# Patient Record
Sex: Female | Born: 1935 | Race: White | Hispanic: No | Smoking: Former smoker
Health system: Southern US, Community
[De-identification: ages and names within clinical notes are randomized; demographics above are authoritative.]

## PROBLEM LIST (undated history)

## (undated) DIAGNOSIS — I808 Phlebitis and thrombophlebitis of other sites: Secondary | ICD-10-CM

## (undated) DIAGNOSIS — K449 Diaphragmatic hernia without obstruction or gangrene: Secondary | ICD-10-CM

## (undated) DIAGNOSIS — E785 Hyperlipidemia, unspecified: Secondary | ICD-10-CM

## (undated) DIAGNOSIS — Z87891 Personal history of nicotine dependence: Secondary | ICD-10-CM

## (undated) DIAGNOSIS — K922 Gastrointestinal hemorrhage, unspecified: Secondary | ICD-10-CM

## (undated) DIAGNOSIS — K219 Gastro-esophageal reflux disease without esophagitis: Secondary | ICD-10-CM

## (undated) DIAGNOSIS — I10 Essential (primary) hypertension: Secondary | ICD-10-CM

## (undated) DIAGNOSIS — I429 Cardiomyopathy, unspecified: Secondary | ICD-10-CM

## (undated) DIAGNOSIS — D6489 Other specified anemias: Secondary | ICD-10-CM

## (undated) DIAGNOSIS — Q279 Congenital malformation of peripheral vascular system, unspecified: Secondary | ICD-10-CM

## (undated) DIAGNOSIS — J301 Allergic rhinitis due to pollen: Secondary | ICD-10-CM

## (undated) DIAGNOSIS — M479 Spondylosis, unspecified: Secondary | ICD-10-CM

## (undated) DIAGNOSIS — M545 Low back pain: Secondary | ICD-10-CM

## (undated) DIAGNOSIS — E119 Type 2 diabetes mellitus without complications: Secondary | ICD-10-CM

## (undated) DIAGNOSIS — Z87442 Personal history of urinary calculi: Secondary | ICD-10-CM

## (undated) DIAGNOSIS — I252 Old myocardial infarction: Secondary | ICD-10-CM

## (undated) DIAGNOSIS — Z6740 Type O blood, Rh positive: Secondary | ICD-10-CM

## (undated) DIAGNOSIS — F329 Major depressive disorder, single episode, unspecified: Secondary | ICD-10-CM

## (undated) DIAGNOSIS — K5730 Diverticulosis of large intestine without perforation or abscess without bleeding: Secondary | ICD-10-CM

## (undated) DIAGNOSIS — M1990 Unspecified osteoarthritis, unspecified site: Secondary | ICD-10-CM

## (undated) HISTORY — DX: Diverticulosis of large intestine without perforation or abscess without bleeding: K57.30

## (undated) HISTORY — PX: ABDOMINAL HYSTERECTOMY: SHX81

## (undated) HISTORY — DX: Type 2 diabetes mellitus without complications: E11.9

## (undated) HISTORY — DX: Phlebitis and thrombophlebitis of other sites: I80.8

## (undated) HISTORY — DX: Unspecified osteoarthritis, unspecified site: M19.90

## (undated) HISTORY — DX: Personal history of urinary calculi: Z87.442

## (undated) HISTORY — PX: APPENDECTOMY: SHX54

## (undated) HISTORY — DX: Essential (primary) hypertension: I10

## (undated) HISTORY — DX: Major depressive disorder, single episode, unspecified: F32.9

## (undated) HISTORY — PX: CHOLECYSTECTOMY: SHX55

## (undated) HISTORY — DX: Spondylosis, unspecified: M47.9

## (undated) HISTORY — PX: CARDIAC CATHETERIZATION: SHX172

## (undated) HISTORY — DX: Congenital malformation of peripheral vascular system, unspecified: Q27.9

## (undated) HISTORY — DX: Low back pain: M54.5

## (undated) HISTORY — DX: Allergic rhinitis due to pollen: J30.1

## (undated) HISTORY — PX: KNEE ARTHROSCOPY: SUR90

## (undated) HISTORY — DX: Personal history of nicotine dependence: Z87.891

## (undated) HISTORY — DX: Old myocardial infarction: I25.2

## (undated) HISTORY — DX: Other specified anemias: D64.89

## (undated) HISTORY — DX: Type O blood, Rh positive: Z67.40

## (undated) HISTORY — DX: Gastro-esophageal reflux disease without esophagitis: K21.9

## (undated) HISTORY — PX: KIDNEY STONE SURGERY: SHX686

## (undated) HISTORY — PX: CERVICAL DISC SURGERY: SHX588

## (undated) HISTORY — DX: Hyperlipidemia, unspecified: E78.5

## (undated) HISTORY — DX: Diaphragmatic hernia without obstruction or gangrene: K44.9

## (undated) HISTORY — DX: Cardiomyopathy, unspecified: I42.9

## (undated) HISTORY — DX: Gastrointestinal hemorrhage, unspecified: K92.2

---

## 1998-03-14 ENCOUNTER — Observation Stay (HOSPITAL_COMMUNITY): Admission: RE | Admit: 1998-03-14 | Discharge: 1998-03-15 | Payer: Self-pay

## 1998-12-26 ENCOUNTER — Other Ambulatory Visit: Admission: RE | Admit: 1998-12-26 | Discharge: 1998-12-26 | Payer: Self-pay | Admitting: Obstetrics and Gynecology

## 1999-04-20 ENCOUNTER — Encounter: Payer: Self-pay | Admitting: Urology

## 1999-04-23 ENCOUNTER — Encounter: Payer: Self-pay | Admitting: Urology

## 1999-04-23 ENCOUNTER — Ambulatory Visit (HOSPITAL_COMMUNITY): Admission: RE | Admit: 1999-04-23 | Discharge: 1999-04-23 | Payer: Self-pay | Admitting: Urology

## 1999-04-24 ENCOUNTER — Encounter: Payer: Self-pay | Admitting: Urology

## 2000-01-02 ENCOUNTER — Other Ambulatory Visit: Admission: RE | Admit: 2000-01-02 | Discharge: 2000-01-02 | Payer: Self-pay | Admitting: Obstetrics and Gynecology

## 2000-02-12 ENCOUNTER — Encounter: Admission: RE | Admit: 2000-02-12 | Discharge: 2000-02-12 | Payer: Self-pay | Admitting: Neurological Surgery

## 2000-02-12 ENCOUNTER — Encounter: Payer: Self-pay | Admitting: Neurological Surgery

## 2001-02-27 ENCOUNTER — Other Ambulatory Visit: Admission: RE | Admit: 2001-02-27 | Discharge: 2001-02-27 | Payer: Self-pay | Admitting: Obstetrics and Gynecology

## 2003-05-20 ENCOUNTER — Encounter: Payer: Self-pay | Admitting: Neurological Surgery

## 2003-05-24 ENCOUNTER — Inpatient Hospital Stay (HOSPITAL_COMMUNITY): Admission: RE | Admit: 2003-05-24 | Discharge: 2003-05-27 | Payer: Self-pay | Admitting: Neurological Surgery

## 2003-05-24 ENCOUNTER — Encounter: Payer: Self-pay | Admitting: Neurological Surgery

## 2003-05-27 ENCOUNTER — Encounter: Payer: Self-pay | Admitting: Neurological Surgery

## 2003-06-22 ENCOUNTER — Ambulatory Visit (HOSPITAL_COMMUNITY): Admission: RE | Admit: 2003-06-22 | Discharge: 2003-06-22 | Payer: Self-pay | Admitting: Internal Medicine

## 2003-12-29 ENCOUNTER — Ambulatory Visit (HOSPITAL_COMMUNITY): Admission: RE | Admit: 2003-12-29 | Discharge: 2003-12-29 | Payer: Self-pay | Admitting: Urology

## 2005-01-23 ENCOUNTER — Ambulatory Visit: Payer: Self-pay | Admitting: Internal Medicine

## 2005-07-24 ENCOUNTER — Ambulatory Visit: Payer: Self-pay | Admitting: Internal Medicine

## 2005-12-23 ENCOUNTER — Ambulatory Visit: Payer: Self-pay | Admitting: Internal Medicine

## 2006-03-17 ENCOUNTER — Ambulatory Visit: Payer: Self-pay | Admitting: Internal Medicine

## 2006-05-25 ENCOUNTER — Ambulatory Visit: Payer: Self-pay | Admitting: Cardiology

## 2006-05-25 ENCOUNTER — Inpatient Hospital Stay (HOSPITAL_COMMUNITY): Admission: AD | Admit: 2006-05-25 | Discharge: 2006-05-27 | Payer: Self-pay | Admitting: Internal Medicine

## 2006-05-29 ENCOUNTER — Ambulatory Visit: Payer: Self-pay | Admitting: Internal Medicine

## 2006-06-18 ENCOUNTER — Encounter: Payer: Self-pay | Admitting: Internal Medicine

## 2006-06-18 ENCOUNTER — Ambulatory Visit: Payer: Self-pay | Admitting: Cardiology

## 2006-07-04 ENCOUNTER — Ambulatory Visit: Payer: Self-pay

## 2006-07-04 ENCOUNTER — Ambulatory Visit: Payer: Self-pay | Admitting: Cardiology

## 2006-07-04 ENCOUNTER — Encounter: Payer: Self-pay | Admitting: Cardiology

## 2006-07-10 ENCOUNTER — Encounter (HOSPITAL_COMMUNITY): Admission: RE | Admit: 2006-07-10 | Discharge: 2006-10-08 | Payer: Self-pay | Admitting: Cardiology

## 2006-07-28 ENCOUNTER — Ambulatory Visit: Payer: Self-pay | Admitting: Internal Medicine

## 2006-07-28 LAB — CONVERTED CEMR LAB
Hgb A1c MFr Bld: 6 % (ref 4.6–6.0)
VLDL: 19 mg/dL (ref 0–40)

## 2006-09-05 ENCOUNTER — Ambulatory Visit: Payer: Self-pay | Admitting: Cardiology

## 2006-12-08 ENCOUNTER — Ambulatory Visit: Payer: Self-pay | Admitting: Internal Medicine

## 2006-12-08 LAB — CONVERTED CEMR LAB
AST: 17 units/L (ref 0–37)
Cholesterol: 117 mg/dL (ref 0–200)
Hgb A1c MFr Bld: 6.8 % — ABNORMAL HIGH (ref 4.6–6.0)

## 2007-05-05 ENCOUNTER — Encounter: Payer: Self-pay | Admitting: Internal Medicine

## 2007-05-06 ENCOUNTER — Ambulatory Visit: Payer: Self-pay | Admitting: Internal Medicine

## 2007-05-06 ENCOUNTER — Inpatient Hospital Stay (HOSPITAL_COMMUNITY): Admission: AD | Admit: 2007-05-06 | Discharge: 2007-05-08 | Payer: Self-pay | Admitting: Internal Medicine

## 2007-05-06 ENCOUNTER — Encounter: Payer: Self-pay | Admitting: Internal Medicine

## 2007-05-06 DIAGNOSIS — I252 Old myocardial infarction: Secondary | ICD-10-CM | POA: Insufficient documentation

## 2007-05-06 DIAGNOSIS — M479 Spondylosis, unspecified: Secondary | ICD-10-CM

## 2007-05-06 DIAGNOSIS — M1990 Unspecified osteoarthritis, unspecified site: Secondary | ICD-10-CM

## 2007-05-06 DIAGNOSIS — J301 Allergic rhinitis due to pollen: Secondary | ICD-10-CM | POA: Insufficient documentation

## 2007-05-06 DIAGNOSIS — I10 Essential (primary) hypertension: Secondary | ICD-10-CM

## 2007-05-06 DIAGNOSIS — E785 Hyperlipidemia, unspecified: Secondary | ICD-10-CM

## 2007-05-06 DIAGNOSIS — K219 Gastro-esophageal reflux disease without esophagitis: Secondary | ICD-10-CM

## 2007-05-06 DIAGNOSIS — E119 Type 2 diabetes mellitus without complications: Secondary | ICD-10-CM

## 2007-05-06 HISTORY — DX: Spondylosis, unspecified: M47.9

## 2007-05-06 HISTORY — DX: Allergic rhinitis due to pollen: J30.1

## 2007-05-06 HISTORY — DX: Gastro-esophageal reflux disease without esophagitis: K21.9

## 2007-05-06 HISTORY — DX: Essential (primary) hypertension: I10

## 2007-05-06 HISTORY — DX: Unspecified osteoarthritis, unspecified site: M19.90

## 2007-05-06 HISTORY — DX: Old myocardial infarction: I25.2

## 2007-05-06 HISTORY — DX: Hyperlipidemia, unspecified: E78.5

## 2007-05-06 HISTORY — DX: Type 2 diabetes mellitus without complications: E11.9

## 2007-05-06 LAB — CONVERTED CEMR LAB
ALT: 12 units/L (ref 0–35)
Albumin: 3.7 g/dL (ref 3.5–5.2)
Alkaline Phosphatase: 72 units/L (ref 39–117)
BUN: 18 mg/dL (ref 6–23)
Basophils Absolute: 0 10*3/uL (ref 0.0–0.1)
Basophils Relative: 0.7 % (ref 0.0–1.0)
CO2: 30 meq/L (ref 19–32)
Calcium: 9.4 mg/dL (ref 8.4–10.5)
Creatinine, Ser: 1.2 mg/dL (ref 0.4–1.2)
GFR calc Af Amer: 57 mL/min
MCHC: 30.8 g/dL (ref 30.0–36.0)
Monocytes Relative: 10.4 % (ref 3.0–11.0)
Neutro Abs: 4.3 10*3/uL (ref 1.4–7.7)
Platelets: 443 10*3/uL — ABNORMAL HIGH (ref 150–400)
RDW: 19.9 % — ABNORMAL HIGH (ref 11.5–14.6)
TSH: 1.35 microintl units/mL (ref 0.35–5.50)
Total Protein: 6.6 g/dL (ref 6.0–8.3)

## 2007-05-07 ENCOUNTER — Encounter: Payer: Self-pay | Admitting: Internal Medicine

## 2007-05-07 ENCOUNTER — Encounter: Payer: Self-pay | Admitting: Gastroenterology

## 2007-05-08 ENCOUNTER — Encounter (INDEPENDENT_AMBULATORY_CARE_PROVIDER_SITE_OTHER): Payer: Self-pay | Admitting: *Deleted

## 2007-05-08 ENCOUNTER — Encounter: Payer: Self-pay | Admitting: Internal Medicine

## 2007-05-11 ENCOUNTER — Ambulatory Visit: Payer: Self-pay | Admitting: Internal Medicine

## 2007-05-11 LAB — CONVERTED CEMR LAB
Basophils Relative: 0.7 % (ref 0.0–1.0)
Eosinophils Absolute: 0.3 10*3/uL (ref 0.0–0.6)
Eosinophils Relative: 3.6 % (ref 0.0–5.0)
HCT: 32.6 % — ABNORMAL LOW (ref 36.0–46.0)
Hemoglobin: 10.6 g/dL — ABNORMAL LOW (ref 12.0–15.0)
Lymphocytes Relative: 13.6 % (ref 12.0–46.0)
MCV: 74.4 fL — ABNORMAL LOW (ref 78.0–100.0)
Monocytes Absolute: 0.8 10*3/uL — ABNORMAL HIGH (ref 0.2–0.7)
Neutro Abs: 5.3 10*3/uL (ref 1.4–7.7)
Neutrophils Relative %: 71.5 % (ref 43.0–77.0)
Platelets: 344 10*3/uL (ref 150–400)
WBC: 7.5 10*3/uL (ref 4.5–10.5)

## 2007-05-13 ENCOUNTER — Ambulatory Visit: Payer: Self-pay | Admitting: Gastroenterology

## 2007-05-19 ENCOUNTER — Ambulatory Visit: Payer: Self-pay | Admitting: Internal Medicine

## 2007-05-19 ENCOUNTER — Encounter: Payer: Self-pay | Admitting: Internal Medicine

## 2007-05-20 ENCOUNTER — Ambulatory Visit: Payer: Self-pay | Admitting: Internal Medicine

## 2007-05-20 DIAGNOSIS — D6489 Other specified anemias: Secondary | ICD-10-CM

## 2007-05-20 HISTORY — DX: Other specified anemias: D64.89

## 2007-06-05 ENCOUNTER — Telehealth: Payer: Self-pay | Admitting: Internal Medicine

## 2007-06-29 ENCOUNTER — Ambulatory Visit: Payer: Self-pay | Admitting: Internal Medicine

## 2007-06-29 LAB — CONVERTED CEMR LAB
Basophils Absolute: 0 10*3/uL (ref 0.0–0.1)
Eosinophils Absolute: 0.1 10*3/uL (ref 0.0–0.6)
Lymphocytes Relative: 17.8 % (ref 12.0–46.0)
MCHC: 33.5 g/dL (ref 30.0–36.0)
MCV: 84.2 fL (ref 78.0–100.0)
Monocytes Relative: 8.9 % (ref 3.0–11.0)
Neutro Abs: 4.4 10*3/uL (ref 1.4–7.7)
Platelets: 265 10*3/uL (ref 150–400)
RBC: 3.97 M/uL (ref 3.87–5.11)

## 2007-07-03 ENCOUNTER — Encounter: Payer: Self-pay | Admitting: Internal Medicine

## 2007-07-06 ENCOUNTER — Encounter: Payer: Self-pay | Admitting: Internal Medicine

## 2007-07-14 ENCOUNTER — Ambulatory Visit: Payer: Self-pay | Admitting: Internal Medicine

## 2007-07-14 DIAGNOSIS — Z87442 Personal history of urinary calculi: Secondary | ICD-10-CM

## 2007-07-14 HISTORY — DX: Personal history of urinary calculi: Z87.442

## 2007-07-14 LAB — CONVERTED CEMR LAB
Basophils Relative: 0.7 % (ref 0.0–1.0)
Eosinophils Absolute: 0.2 10*3/uL (ref 0.0–0.6)
Eosinophils Relative: 2.3 % (ref 0.0–5.0)
HDL: 44.2 mg/dL (ref >39.0)
Hemoglobin: 12.4 g/dL (ref 12.0–15.0)
Hgb A1c MFr Bld: 5.8 % (ref 4.6–6.0)
Lymphocytes Relative: 20.4 % (ref 12.0–46.0)
MCV: 88.2 fL (ref 78.0–100.0)
Monocytes Absolute: 0.5 10*3/uL (ref 0.2–0.7)
Monocytes Relative: 7.3 % (ref 3.0–11.0)
Neutro Abs: 4.6 10*3/uL (ref 1.4–7.7)
Platelets: 264 10*3/uL (ref 150–400)
Triglycerides: 117 mg/dL (ref 0–149)
VLDL: 23 mg/dL (ref 0–40)

## 2007-09-08 ENCOUNTER — Encounter: Payer: Self-pay | Admitting: Internal Medicine

## 2007-09-08 ENCOUNTER — Ambulatory Visit: Payer: Self-pay | Admitting: Cardiology

## 2007-09-11 ENCOUNTER — Telehealth: Payer: Self-pay | Admitting: Internal Medicine

## 2007-10-14 ENCOUNTER — Ambulatory Visit: Payer: Self-pay | Admitting: Internal Medicine

## 2007-10-14 DIAGNOSIS — K922 Gastrointestinal hemorrhage, unspecified: Secondary | ICD-10-CM

## 2007-10-14 HISTORY — DX: Gastrointestinal hemorrhage, unspecified: K92.2

## 2007-10-14 LAB — CONVERTED CEMR LAB
Basophils Absolute: 0 10*3/uL (ref 0.0–0.1)
Basophils Relative: 0.2 % (ref 0.0–1.0)
Eosinophils Relative: 2.5 % (ref 0.0–5.0)
HCT: 35.5 % — ABNORMAL LOW (ref 36.0–46.0)
Hemoglobin: 12.4 g/dL (ref 12.0–15.0)
MCHC: 34.8 g/dL (ref 30.0–36.0)
Monocytes Absolute: 0.7 10*3/uL (ref 0.2–0.7)
Neutrophils Relative %: 69.8 % (ref 43.0–77.0)
RDW: 12.4 % (ref 11.5–14.6)
WBC: 6.7 10*3/uL (ref 4.5–10.5)

## 2007-10-15 ENCOUNTER — Telehealth (INDEPENDENT_AMBULATORY_CARE_PROVIDER_SITE_OTHER): Payer: Self-pay

## 2007-10-22 ENCOUNTER — Ambulatory Visit (HOSPITAL_COMMUNITY): Admission: RE | Admit: 2007-10-22 | Discharge: 2007-10-22 | Payer: Self-pay | Admitting: Urology

## 2007-10-22 ENCOUNTER — Encounter (INDEPENDENT_AMBULATORY_CARE_PROVIDER_SITE_OTHER): Payer: Self-pay | Admitting: *Deleted

## 2007-12-14 ENCOUNTER — Telehealth: Payer: Self-pay | Admitting: Internal Medicine

## 2008-01-11 ENCOUNTER — Ambulatory Visit: Payer: Self-pay | Admitting: Internal Medicine

## 2008-02-09 ENCOUNTER — Telehealth: Payer: Self-pay | Admitting: Internal Medicine

## 2008-02-15 ENCOUNTER — Telehealth: Payer: Self-pay | Admitting: Internal Medicine

## 2008-03-16 ENCOUNTER — Telehealth: Payer: Self-pay | Admitting: Internal Medicine

## 2008-03-18 ENCOUNTER — Ambulatory Visit: Payer: Self-pay | Admitting: Internal Medicine

## 2008-03-18 DIAGNOSIS — K5732 Diverticulitis of large intestine without perforation or abscess without bleeding: Secondary | ICD-10-CM

## 2008-03-18 LAB — CONVERTED CEMR LAB
Bilirubin Urine: NEGATIVE
Blood in Urine, dipstick: NEGATIVE
Ketones, urine, test strip: NEGATIVE
Urobilinogen, UA: NEGATIVE

## 2008-03-21 ENCOUNTER — Telehealth: Payer: Self-pay | Admitting: Internal Medicine

## 2008-04-18 ENCOUNTER — Ambulatory Visit: Payer: Self-pay | Admitting: Internal Medicine

## 2008-04-19 LAB — CONVERTED CEMR LAB: Hgb A1c MFr Bld: 6.8 % — ABNORMAL HIGH (ref 4.6–6.0)

## 2008-07-18 ENCOUNTER — Ambulatory Visit: Payer: Self-pay | Admitting: Internal Medicine

## 2008-07-18 LAB — CONVERTED CEMR LAB
ALT: 17 U/L
AST: 18 U/L
Albumin: 3.9 g/dL
Alkaline Phosphatase: 73 U/L
BUN: 17 mg/dL
Basophils Absolute: 0.1 10*3/uL
Basophils Relative: 1.1 %
Bilirubin, Direct: 0.1 mg/dL
CO2: 31 meq/L
Calcium: 9.2 mg/dL
Chloride: 105 meq/L
Cholesterol: 159 mg/dL
Creatinine, Ser: 0.9 mg/dL
Eosinophils Absolute: 0.2 10*3/uL
Eosinophils Relative: 3 %
GFR calc Af Amer: 79 mL/min
GFR calc non Af Amer: 65 mL/min
Glucose, Bld: 136 mg/dL — ABNORMAL HIGH
HCT: 32.7 % — ABNORMAL LOW
HDL: 50.2 mg/dL
Hemoglobin: 11 g/dL — ABNORMAL LOW
Hgb A1c MFr Bld: 6.2 % — ABNORMAL HIGH
LDL Cholesterol: 81 mg/dL
Lymphocytes Relative: 20.6 %
MCHC: 33.5 g/dL
MCV: 90.3 fL
Monocytes Absolute: 0.6 10*3/uL
Monocytes Relative: 9.5 %
Neutro Abs: 4 10*3/uL
Neutrophils Relative %: 65.8 %
Platelets: 256 10*3/uL
Potassium: 4.6 meq/L
RBC: 3.63 M/uL — ABNORMAL LOW
RDW: 13.1 %
Sodium: 144 meq/L
TSH: 2.64 u[IU]/mL
Total Bilirubin: 0.6 mg/dL
Total CHOL/HDL Ratio: 3.2
Total Protein: 7 g/dL
Triglycerides: 139 mg/dL
VLDL: 28 mg/dL
WBC: 6.2 10*3/uL

## 2008-08-03 ENCOUNTER — Encounter: Payer: Self-pay | Admitting: Internal Medicine

## 2008-08-15 ENCOUNTER — Telehealth: Payer: Self-pay | Admitting: Internal Medicine

## 2008-08-19 ENCOUNTER — Encounter: Admission: RE | Admit: 2008-08-19 | Discharge: 2008-08-19 | Payer: Self-pay | Admitting: Obstetrics and Gynecology

## 2008-09-08 ENCOUNTER — Ambulatory Visit: Payer: Self-pay | Admitting: Internal Medicine

## 2008-09-19 ENCOUNTER — Telehealth: Payer: Self-pay | Admitting: Internal Medicine

## 2008-09-19 ENCOUNTER — Ambulatory Visit: Payer: Self-pay | Admitting: Internal Medicine

## 2008-09-19 DIAGNOSIS — R079 Chest pain, unspecified: Secondary | ICD-10-CM

## 2008-09-19 LAB — CONVERTED CEMR LAB
Basophils Absolute: 0.1 10*3/uL (ref 0.0–0.1)
Basophils Relative: 0.7 % (ref 0.0–3.0)
Eosinophils Relative: 1.5 % (ref 0.0–5.0)
Hemoglobin: 12.2 g/dL (ref 12.0–15.0)
Lymphocytes Relative: 13.8 % (ref 12.0–46.0)
MCHC: 33.3 g/dL (ref 30.0–36.0)
Monocytes Relative: 10 % (ref 3.0–12.0)
Neutro Abs: 6.2 10*3/uL (ref 1.4–7.7)
Neutrophils Relative %: 74 % (ref 43.0–77.0)
RBC: 3.96 M/uL (ref 3.87–5.11)
WBC: 8.4 10*3/uL (ref 4.5–10.5)

## 2008-09-20 DIAGNOSIS — K625 Hemorrhage of anus and rectum: Secondary | ICD-10-CM

## 2008-09-20 DIAGNOSIS — Q279 Congenital malformation of peripheral vascular system, unspecified: Secondary | ICD-10-CM | POA: Insufficient documentation

## 2008-09-20 DIAGNOSIS — K5730 Diverticulosis of large intestine without perforation or abscess without bleeding: Secondary | ICD-10-CM

## 2008-09-20 DIAGNOSIS — K449 Diaphragmatic hernia without obstruction or gangrene: Secondary | ICD-10-CM | POA: Insufficient documentation

## 2008-09-20 HISTORY — DX: Diaphragmatic hernia without obstruction or gangrene: K44.9

## 2008-09-20 HISTORY — DX: Diverticulosis of large intestine without perforation or abscess without bleeding: K57.30

## 2008-09-20 HISTORY — DX: Congenital malformation of peripheral vascular system, unspecified: Q27.9

## 2008-09-21 ENCOUNTER — Ambulatory Visit: Payer: Self-pay | Admitting: Internal Medicine

## 2008-09-21 ENCOUNTER — Encounter: Payer: Self-pay | Admitting: Internal Medicine

## 2008-09-21 DIAGNOSIS — M545 Low back pain, unspecified: Secondary | ICD-10-CM

## 2008-09-21 HISTORY — DX: Low back pain, unspecified: M54.50

## 2008-09-26 ENCOUNTER — Telehealth: Payer: Self-pay | Admitting: Family Medicine

## 2008-09-26 ENCOUNTER — Telehealth (INDEPENDENT_AMBULATORY_CARE_PROVIDER_SITE_OTHER): Payer: Self-pay | Admitting: *Deleted

## 2008-09-27 ENCOUNTER — Encounter: Payer: Self-pay | Admitting: Internal Medicine

## 2008-09-29 ENCOUNTER — Inpatient Hospital Stay (HOSPITAL_COMMUNITY): Admission: RE | Admit: 2008-09-29 | Discharge: 2008-09-30 | Payer: Self-pay | Admitting: Neurological Surgery

## 2008-09-29 ENCOUNTER — Encounter (INDEPENDENT_AMBULATORY_CARE_PROVIDER_SITE_OTHER): Payer: Self-pay | Admitting: Neurological Surgery

## 2008-10-17 ENCOUNTER — Ambulatory Visit: Payer: Self-pay | Admitting: Internal Medicine

## 2008-10-17 LAB — CONVERTED CEMR LAB
Eosinophils Absolute: 0.2 10*3/uL (ref 0.0–0.7)
HCT: 35.7 % — ABNORMAL LOW (ref 36.0–46.0)
Hemoglobin: 12.5 g/dL (ref 12.0–15.0)
Hgb A1c MFr Bld: 6.3 % — ABNORMAL HIGH (ref 4.6–6.0)
MCHC: 34.9 g/dL (ref 30.0–36.0)
MCV: 89 fL (ref 78.0–100.0)
Monocytes Absolute: 0.8 10*3/uL (ref 0.1–1.0)
Monocytes Relative: 10.4 % (ref 3.0–12.0)
Neutro Abs: 5 10*3/uL (ref 1.4–7.7)
Platelets: 222 10*3/uL (ref 150–400)
RDW: 13.8 % (ref 11.5–14.6)

## 2008-10-26 ENCOUNTER — Encounter: Payer: Self-pay | Admitting: Internal Medicine

## 2008-11-03 ENCOUNTER — Ambulatory Visit (HOSPITAL_COMMUNITY): Admission: RE | Admit: 2008-11-03 | Discharge: 2008-11-03 | Payer: Self-pay | Admitting: Obstetrics and Gynecology

## 2008-11-21 ENCOUNTER — Encounter: Payer: Self-pay | Admitting: Cardiology

## 2008-11-21 ENCOUNTER — Ambulatory Visit: Payer: Self-pay | Admitting: Cardiology

## 2008-11-21 DIAGNOSIS — I429 Cardiomyopathy, unspecified: Secondary | ICD-10-CM

## 2008-11-21 HISTORY — DX: Cardiomyopathy, unspecified: I42.9

## 2009-01-17 ENCOUNTER — Ambulatory Visit: Payer: Self-pay | Admitting: Internal Medicine

## 2009-01-17 LAB — CONVERTED CEMR LAB
Basophils Absolute: 0.1 10*3/uL (ref 0.0–0.1)
Eosinophils Relative: 2.7 % (ref 0.0–5.0)
HCT: 35.8 % — ABNORMAL LOW (ref 36.0–46.0)
Hgb A1c MFr Bld: 6.1 % (ref 4.6–6.5)
Lymphs Abs: 1.2 10*3/uL (ref 0.7–4.0)
Monocytes Absolute: 0.7 10*3/uL (ref 0.1–1.0)
Monocytes Relative: 12.2 % — ABNORMAL HIGH (ref 3.0–12.0)
Neutrophils Relative %: 63.7 % (ref 43.0–77.0)
Platelets: 223 10*3/uL (ref 150.0–400.0)
RDW: 13.4 % (ref 11.5–14.6)
WBC: 5.8 10*3/uL (ref 4.5–10.5)

## 2009-01-19 IMAGING — CR DG CHEST 2V
2 series · 2 of 2 positions shown · non-contrast
Comparison: 05/26/2006

CLINICAL DATA: T12 compression fracture, hypertension, diabetes

CHEST - 2 VIEW

[view not recorded (1 of 2)]
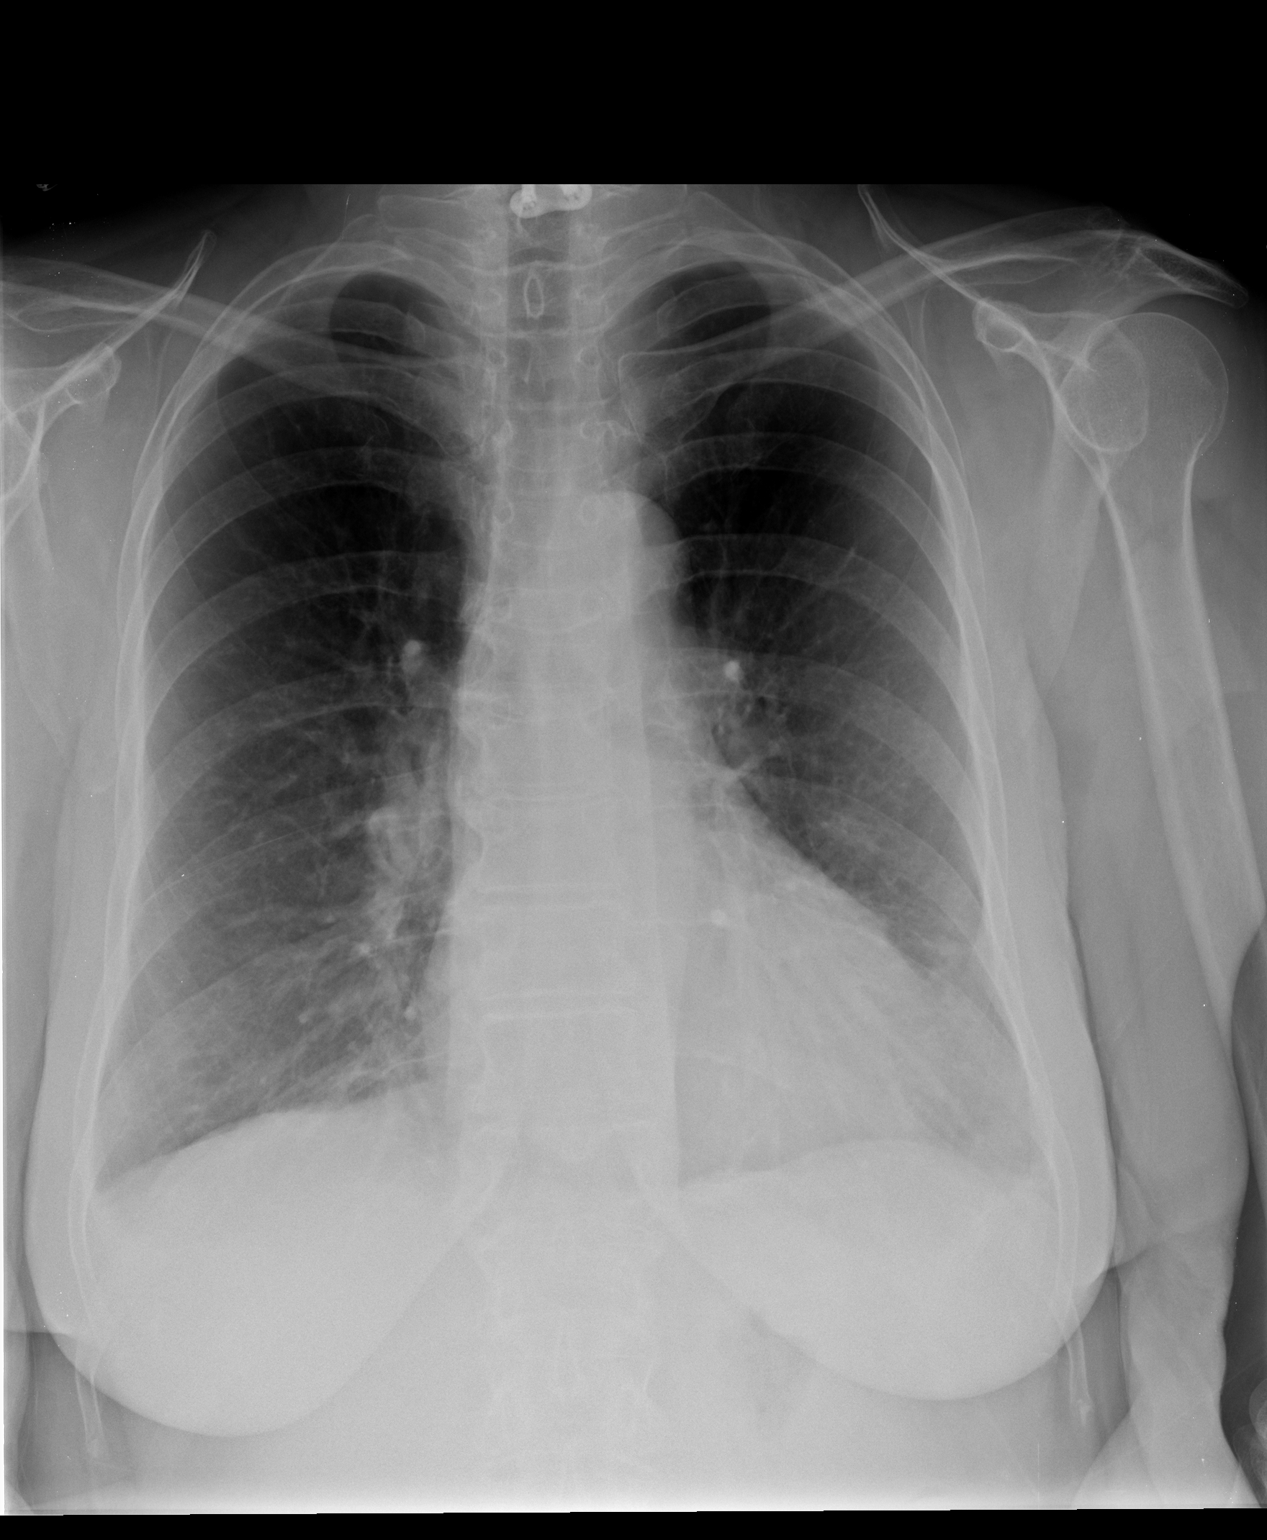

[view not recorded (2 of 2)]
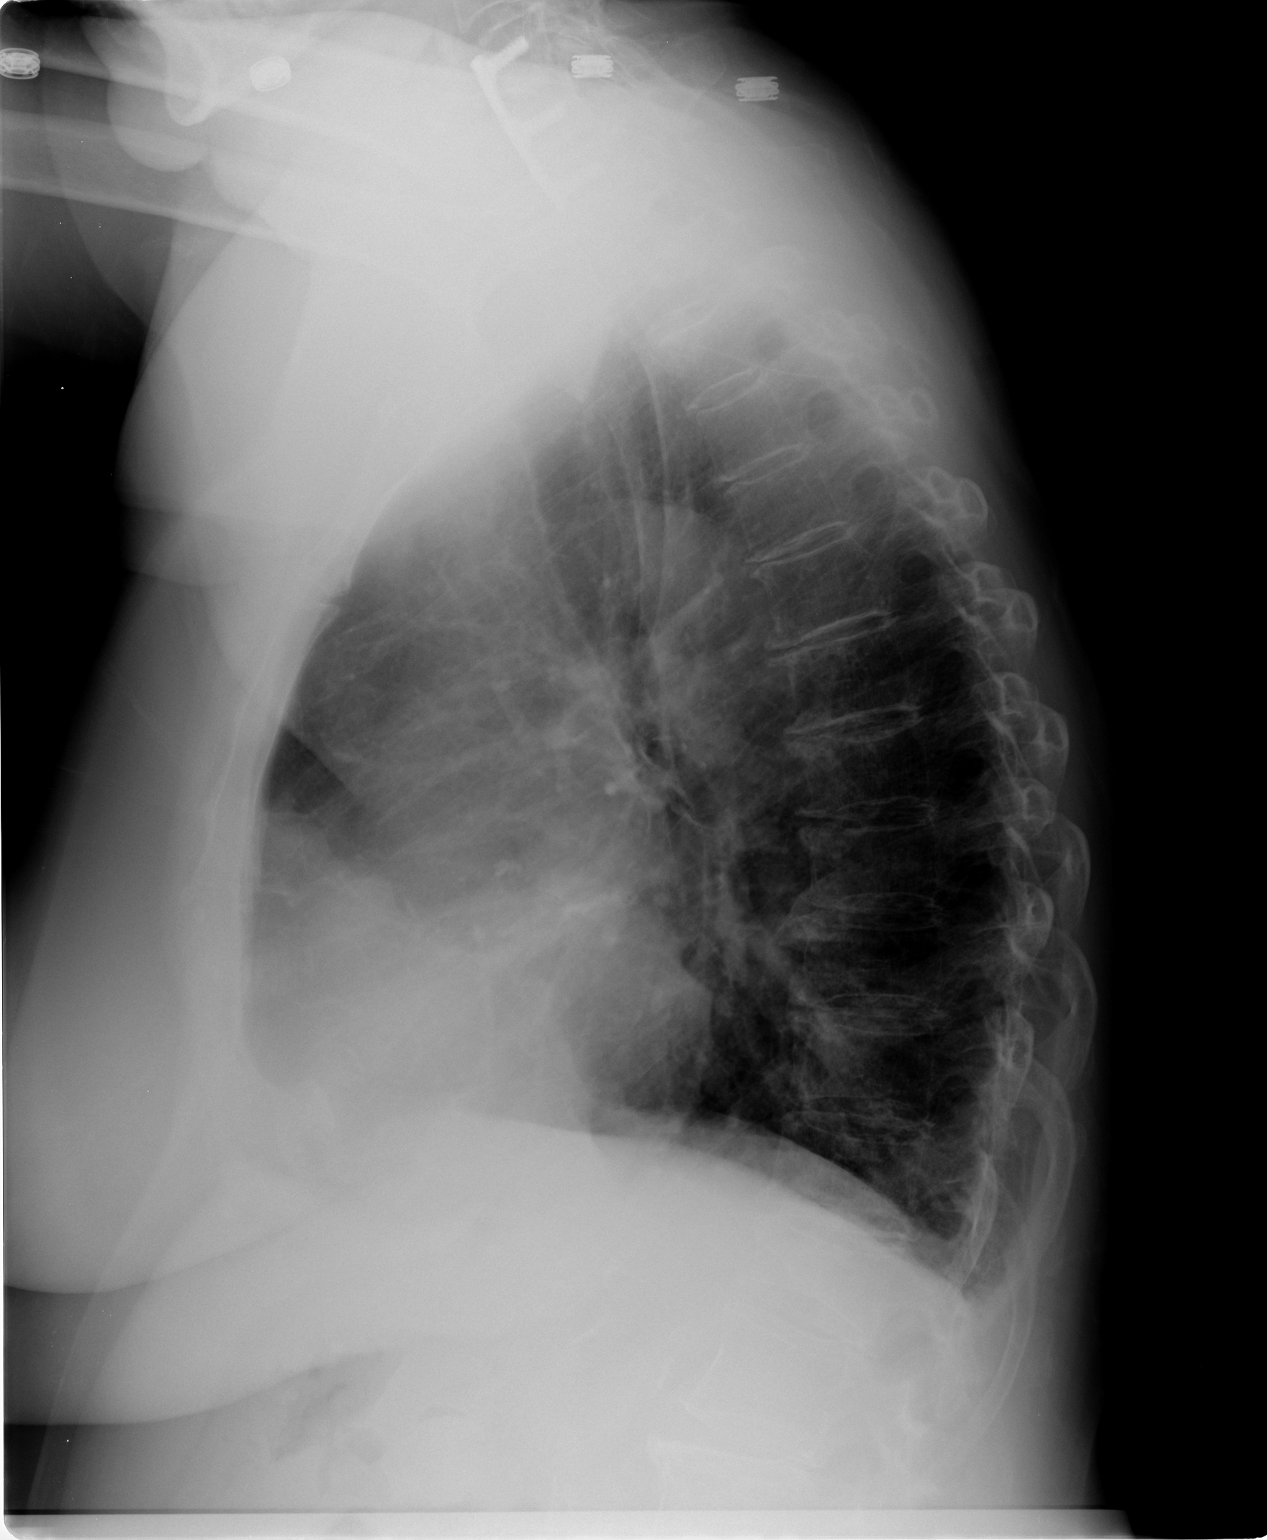

[2 of 2 positions shown; findings below may reference images not displayed]

FINDINGS: Cardiac enlargement.
Normal mediastinal contours and pulmonary vascularity.
Mild chronic bronchitic changes with minimal right basilar
atelectasis.
Underlying COPD.
No acute infiltrate or effusion.
Prior cervical spine fusion.
Bony demineralization with scattered end plate spur formation
thoracic spine.
IMPRESSION: Cardiomegaly and COPD with minimal right basilar atelectasis.

## 2009-01-26 ENCOUNTER — Telehealth: Payer: Self-pay | Admitting: Internal Medicine

## 2009-02-13 ENCOUNTER — Telehealth: Payer: Self-pay | Admitting: Internal Medicine

## 2009-02-23 ENCOUNTER — Telehealth: Payer: Self-pay | Admitting: Internal Medicine

## 2009-02-28 ENCOUNTER — Ambulatory Visit: Payer: Self-pay | Admitting: Internal Medicine

## 2009-02-28 DIAGNOSIS — J069 Acute upper respiratory infection, unspecified: Secondary | ICD-10-CM | POA: Insufficient documentation

## 2009-04-21 ENCOUNTER — Ambulatory Visit: Payer: Self-pay | Admitting: Internal Medicine

## 2009-04-21 LAB — CONVERTED CEMR LAB
Basophils Absolute: 0 10*3/uL (ref 0.0–0.1)
Eosinophils Relative: 4.1 % (ref 0.0–5.0)
Hemoglobin: 12.3 g/dL (ref 12.0–15.0)
Hgb A1c MFr Bld: 6 % (ref 4.6–6.5)
Lymphocytes Relative: 21.7 % (ref 12.0–46.0)
Monocytes Relative: 8.7 % (ref 3.0–12.0)
Platelets: 263 10*3/uL (ref 150.0–400.0)
RDW: 12.6 % (ref 11.5–14.6)
WBC: 7 10*3/uL (ref 4.5–10.5)

## 2009-05-11 ENCOUNTER — Telehealth: Payer: Self-pay | Admitting: Internal Medicine

## 2009-06-29 ENCOUNTER — Ambulatory Visit: Payer: Self-pay | Admitting: Internal Medicine

## 2009-07-13 ENCOUNTER — Ambulatory Visit: Payer: Self-pay | Admitting: Internal Medicine

## 2009-07-13 DIAGNOSIS — L03119 Cellulitis of unspecified part of limb: Secondary | ICD-10-CM

## 2009-07-13 DIAGNOSIS — L02519 Cutaneous abscess of unspecified hand: Secondary | ICD-10-CM

## 2009-07-14 ENCOUNTER — Telehealth: Payer: Self-pay | Admitting: Internal Medicine

## 2009-07-20 ENCOUNTER — Ambulatory Visit: Payer: Self-pay | Admitting: Internal Medicine

## 2009-07-20 DIAGNOSIS — Z87891 Personal history of nicotine dependence: Secondary | ICD-10-CM

## 2009-07-20 HISTORY — DX: Personal history of nicotine dependence: Z87.891

## 2009-07-20 LAB — CONVERTED CEMR LAB
Albumin: 4.1 g/dL (ref 3.5–5.2)
Alkaline Phosphatase: 69 units/L (ref 39–117)
BUN: 19 mg/dL (ref 6–23)
Basophils Absolute: 0 10*3/uL (ref 0.0–0.1)
Bilirubin, Direct: 0.1 mg/dL (ref 0.0–0.3)
CO2: 27 meq/L (ref 19–32)
Calcium: 9.4 mg/dL (ref 8.4–10.5)
Creatinine, Ser: 0.8 mg/dL (ref 0.4–1.2)
Eosinophils Absolute: 0.2 10*3/uL (ref 0.0–0.7)
GFR calc non Af Amer: 74.43 mL/min (ref >60)
Glucose, Bld: 119 mg/dL — ABNORMAL HIGH (ref 70–99)
HDL: 41.2 mg/dL (ref >39.00)
Lymphocytes Relative: 16.6 % (ref 12.0–46.0)
MCHC: 34.3 g/dL (ref 30.0–36.0)
Neutro Abs: 5 10*3/uL (ref 1.4–7.7)
Neutrophils Relative %: 70.9 % (ref 43.0–77.0)
Platelets: 263 10*3/uL (ref 150.0–400.0)
RDW: 12.2 % (ref 11.5–14.6)
Total Bilirubin: 0.7 mg/dL (ref 0.3–1.2)
Triglycerides: 114 mg/dL (ref 0.0–149.0)
VLDL: 22.8 mg/dL (ref 0.0–40.0)

## 2009-07-25 ENCOUNTER — Ambulatory Visit: Payer: Self-pay | Admitting: Internal Medicine

## 2009-07-25 DIAGNOSIS — I808 Phlebitis and thrombophlebitis of other sites: Secondary | ICD-10-CM

## 2009-07-25 HISTORY — DX: Phlebitis and thrombophlebitis of other sites: I80.8

## 2009-07-31 ENCOUNTER — Encounter: Payer: Self-pay | Admitting: Internal Medicine

## 2009-08-05 ENCOUNTER — Telehealth: Payer: Self-pay | Admitting: Internal Medicine

## 2009-08-16 ENCOUNTER — Ambulatory Visit: Payer: Self-pay | Admitting: Internal Medicine

## 2009-08-22 ENCOUNTER — Encounter: Admission: RE | Admit: 2009-08-22 | Discharge: 2009-08-22 | Payer: Self-pay | Admitting: Obstetrics and Gynecology

## 2009-10-31 ENCOUNTER — Ambulatory Visit: Payer: Self-pay | Admitting: Internal Medicine

## 2009-10-31 DIAGNOSIS — F329 Major depressive disorder, single episode, unspecified: Secondary | ICD-10-CM

## 2009-10-31 DIAGNOSIS — F3289 Other specified depressive episodes: Secondary | ICD-10-CM

## 2009-10-31 HISTORY — DX: Other specified depressive episodes: F32.89

## 2009-10-31 HISTORY — DX: Major depressive disorder, single episode, unspecified: F32.9

## 2009-10-31 LAB — CONVERTED CEMR LAB
Basophils Relative: 0.6 % (ref 0.0–3.0)
Eosinophils Relative: 5.1 % — ABNORMAL HIGH (ref 0.0–5.0)
Hemoglobin: 11.1 g/dL — ABNORMAL LOW (ref 12.0–15.0)
Lymphocytes Relative: 19.9 % (ref 12.0–46.0)
MCV: 95.3 fL (ref 78.0–100.0)
Monocytes Absolute: 0.6 10*3/uL (ref 0.1–1.0)
Neutrophils Relative %: 64.4 % (ref 43.0–77.0)
RBC: 3.52 M/uL — ABNORMAL LOW (ref 3.87–5.11)
WBC: 6.2 10*3/uL (ref 4.5–10.5)

## 2009-11-06 ENCOUNTER — Ambulatory Visit: Payer: Self-pay | Admitting: Cardiology

## 2009-11-20 ENCOUNTER — Ambulatory Visit: Payer: Self-pay

## 2009-11-20 ENCOUNTER — Ambulatory Visit: Payer: Self-pay | Admitting: Cardiovascular Disease

## 2009-11-20 ENCOUNTER — Encounter: Payer: Self-pay | Admitting: Cardiology

## 2009-11-20 ENCOUNTER — Ambulatory Visit (HOSPITAL_COMMUNITY): Admission: RE | Admit: 2009-11-20 | Discharge: 2009-11-20 | Payer: Self-pay | Admitting: Cardiology

## 2009-11-23 ENCOUNTER — Telehealth: Payer: Self-pay | Admitting: Cardiology

## 2009-12-05 ENCOUNTER — Telehealth: Payer: Self-pay | Admitting: Cardiology

## 2010-01-18 ENCOUNTER — Ambulatory Visit: Payer: Self-pay | Admitting: Internal Medicine

## 2010-01-18 LAB — CONVERTED CEMR LAB: Hgb A1c MFr Bld: 6 % (ref 4.6–6.5)

## 2010-07-24 ENCOUNTER — Ambulatory Visit: Payer: Self-pay | Admitting: Internal Medicine

## 2010-07-24 ENCOUNTER — Telehealth: Payer: Self-pay | Admitting: Internal Medicine

## 2010-07-24 LAB — CONVERTED CEMR LAB
AST: 19 units/L (ref 0–37)
Alkaline Phosphatase: 62 units/L (ref 39–117)
BUN: 22 mg/dL (ref 6–23)
Basophils Absolute: 0 10*3/uL (ref 0.0–0.1)
Calcium: 9.6 mg/dL (ref 8.4–10.5)
Cholesterol: 152 mg/dL (ref 0–200)
GFR calc non Af Amer: 58.73 mL/min (ref >60)
Glucose, Bld: 123 mg/dL — ABNORMAL HIGH (ref 70–99)
HDL: 49.2 mg/dL (ref >39.00)
Lymphocytes Relative: 24.2 % (ref 12.0–46.0)
Monocytes Relative: 9.2 % (ref 3.0–12.0)
Platelets: 232 10*3/uL (ref 150.0–400.0)
RDW: 13.4 % (ref 11.5–14.6)
TSH: 1.69 microintl units/mL (ref 0.35–5.50)
Total Bilirubin: 0.5 mg/dL (ref 0.3–1.2)
VLDL: 17 mg/dL (ref 0.0–40.0)

## 2010-07-25 ENCOUNTER — Ambulatory Visit: Payer: Self-pay | Admitting: Internal Medicine

## 2010-07-26 LAB — CONVERTED CEMR LAB
BUN: 20 mg/dL (ref 6–23)
Chloride: 107 meq/L (ref 96–112)
Glucose, Bld: 118 mg/dL — ABNORMAL HIGH (ref 70–99)
Potassium: 4.7 meq/L (ref 3.5–5.1)

## 2010-08-31 ENCOUNTER — Encounter: Admission: RE | Admit: 2010-08-31 | Discharge: 2010-08-31 | Payer: Self-pay | Admitting: Internal Medicine

## 2010-10-10 ENCOUNTER — Telehealth: Payer: Self-pay | Admitting: Internal Medicine

## 2010-10-30 NOTE — Assessment & Plan Note (Signed)
Summary: 1 YR F/U      Allergies Added:   Primary Provider:  Eleonore Chiquito MD   History of Present Illness: The patient is 77 years old and return for management of CAD. She was hospitalized in July of 2007 with a non-ST elevation MI and was found to have nonobstructive disease at cardiac catheterization. 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 Followup echocardiogram in October showed slight anterolateral hypokinesis but overall good LV function.  She has done well since that time has had no recent chest pain shortness of breath or palpitations.  Her husband is quite sick with end-stage renal disease and she has been under a fair amount of stress taking care of him.  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  Her other problems include hypertension hyperlipidemia and diabetes.  Current Medications (verified): 1)  Omeprazole 20 Mg Cpdr (Omeprazole) .Marland Kitchen.. 1 Once Daily 2)  Trazodone Hcl 100 Mg Tabs (Trazodone Hcl) .Marland Kitchen.. 1 At Bedtime 3)  Centrum Silver   Tabs (Multiple Vitamins-Minerals) .... Take One By Mouth Daily 4)  Simvastatin 40 Mg  Tabs (Simvastatin) .Marland Kitchen.. 1 Once Daily 5)  Coreg 6.25 Mg  Tabs (Carvedilol) .Marland Kitchen.. 1 Two Times A Day 6)  Zolpidem Tartrate 5 Mg  Tabs (Zolpidem Tartrate) .... One At Bedtime If Needed For Sleep 7)  Freestyle Test   Strp (Glucose Blood) .... Use As Dir 8)  Adult Aspirin Ec Low Strength 81 Mg  Tbec (Aspirin) .Marland Kitchen.. 1 Once Daily 9)  Ferrous Sulfate 325 (65 Fe) Mg  Tbec (Ferrous Sulfate) .Marland Kitchen.. 1 Once Daily 10)  Glucophage Xr 500 Mg  Tb24 (Metformin Hcl) .... 2 At Bedtime 11)  Hydrocodone-Acetaminophen 5-500 Mg Tabs (Hydrocodone-Acetaminophen) .... One Every 6 Hours As Needed For Pain 12)  Glucosamine-Chondroitin 500-400 Mg Caps (Glucosamine-Chondroitin) .Marland Kitchen.. 1 Tab Daily 13)  Vitamin D 400 Unit Caps (Cholecalciferol) .Marland Kitchen.. 1 Tab Qd 14)  Vitamin C Cr 1000 Mg Cr-Tabs (Ascorbic Acid) .Marland Kitchen.. 1 Tab Qd 15)  Calcium 500 Mg Tabs (Calcium  Carbonate) .Marland Kitchen.. 1 Tab Twice A Day 16)  Benicar 20 Mg Tabs (Olmesartan Medoxomil) .... One Daily 17)  Citalopram Hydrobromide 20 Mg Tabs (Citalopram Hydrobromide) .... One Daily  Allergies (verified): 1)  ! Sulfa 2)  ! Penicillin G Potassium (Penicillin G Potassium) 3)  ! Streptomycin Sulfate (Streptomycin Sulfate) 4)  ! Buprenex (Buprenorphine Hcl) 5)  ! * Ivp Dye  Past History:  Past Medical History: Reviewed history from 10/31/2009 and no changes required. coronary artery disease status post non-ST segment elevation MI August 2007 (TakTsbubo CM?) HIATAL HERNIA (ICD-553.3) DIVERTICULOSIS OF COLON (ICD-562.10) ARTERIOVENOUS MALFORMATION (ICD-747.60) CHEST PAIN (ICD-786.50) DIVERTICULITIS, ACUTE (ICD-562.11) G I BLEED (ICD-578.9) NEPHROLITHIASIS, HX OF (ICD-V13.01) ANEMIA NEC (ICD-285.8) OSTEOARTHRITIS (ICD-715.90) HYPERLIPIDEMIA (ICD-272.4) 1. Nonobstructive coronary artery disease. 2. 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 3. Ejection fraction 55% by recent echo. 4. Diabetes. 5. Hypertension for hyperlipidemia. 6. IVP DYE allergy.  ALLERGIC RHINITIS, SEASONAL (ICD-477.0) DEGENERATIVE JOINT DISEASE, CERVICAL SPINE (ICD-721.90) MYOCARDIAL INFARCTION, HX OF (ICD-412) HYPERTENSION (ICD-401.9) GERD (ICD-530.81) DIABETES MELLITUS, TYPE II (ICD-250.00) Depression  Review of Systems       ROS is negative except as outlined in HPI.   Vital Signs:  Patient profile:   77 year old female Height:      64 inches Weight:      175 pounds BMI:     30.15 Pulse rate:   86 / minute  Resp:     16 per minute BP sitting:   130 / 82  (left arm)  Vitals Entered By: Marrion Coy, CNA (November 06, 2009 3:10 PM)  Physical Exam  Additional Exam:  Gen. Well-nourished, in no distress   Neck: No JVD, thyroid not enlarged, no carotid bruits Lungs: No tachypnea, clear without rales, rhonchi or  wheezes Cardiovascular: Rhythm regular, PMI not displaced,  heart sounds  normal, no murmurs or gallops, no peripheral edema, pulses normal in all 4 extremities. Abdomen: BS normal, abdomen soft and non-tender without masses or organomegaly, no hepatosplenomegaly. MS: No deformities, no cyanosis or clubbing   Neuro:  No focal sns   Skin:  no lesions    Impression & Recommendations:  Problem # 1:  CARDIOMYOPATHY, SECONDARY (ICD-425.9)  in 2007 she had a non-ST elevation MI due to TakoTsubo cardiomyopathy. She also has nonobstructive CAD.  she still had a wall motion abnormality on echocardiography in October 2007. We will repeat her echo to see if this has resolved.  We talked about her returning to primary care and returning to see cardiology on a p.r.n. basis if her echocardiogram looks good. She would like to have a follow up visit with cardiology in the year so we'll arrange that. Her updated medication list for this problem includes:    Coreg 6.25 Mg Tabs (Carvedilol) .Marland Kitchen... 1 two times a day    Adult Aspirin Ec Low Strength 81 Mg Tbec (Aspirin) .Marland Kitchen... 1 once daily    Benicar 20 Mg Tabs (Olmesartan medoxomil) ..... One daily  Her updated medication list for this problem includes:    Coreg 6.25 Mg Tabs (Carvedilol) .Marland Kitchen... 1 two times a day    Adult Aspirin Ec Low Strength 81 Mg Tbec (Aspirin) .Marland Kitchen... 1 once daily    Benicar 20 Mg Tabs (Olmesartan medoxomil) ..... One daily  Orders: EKG w/ Interpretation (93000) Echocardiogram (Echo)  Problem # 2:  HYPERLIPIDEMIA (ICD-272.4) This is followed by Dr. Kirtland Bouchard. Her updated medication list for this problem includes:    Simvastatin 40 Mg Tabs (Simvastatin) .Marland Kitchen... 1 once daily  Her updated medication list for this problem includes:    Simvastatin 40 Mg Tabs (Simvastatin) .Marland Kitchen... 1 once daily  Problem # 3:  HYPERTENSION (ICD-401.9) This is well controlled on current medications. Her updated medication list for this problem includes:    Coreg 6.25  Mg Tabs (Carvedilol) .Marland Kitchen... 1 two times a day    Adult Aspirin Ec Low Strength 81 Mg Tbec (Aspirin) .Marland Kitchen... 1 once daily    Benicar 20 Mg Tabs (Olmesartan medoxomil) ..... One daily  Orders: EKG w/ Interpretation (93000) Echocardiogram (Echo)  Patient Instructions: 1)  Your physician wants you to follow-up in: 1 year with Dr. Shirlee Latch.  You will receive a reminder letter in the mail two months in advance. If you don't receive a letter, please call our office to schedule the follow-up appointment. 2)  Your physician has requested that you have an echocardiogram.  Echocardiography is a painless test that uses sound waves to create images of your heart. It provides your doctor with information about the size and shape of your heart and how well your heart's chambers and valves are working.  This procedure takes approximately one hour. There are no restrictions for this procedure.

## 2010-10-30 NOTE — Assessment & Plan Note (Signed)
Summary: 3 MTH ROV / (TO LAB FOR A1C CK) // RS   Vital Signs:  Patient profile:   77 year old female Weight:      177 pounds Temp:     97.9 degrees F oral BP sitting:   150 / 90  (right arm) Cuff size:   regular  Vitals Entered By: Raechel Ache, RN (October 31, 2009 2:20 PM) CC: rov  no c/o , pt request something for depression, BS 14- - 150 Is Patient Diabetic? Yes   Primary Care Provider:  Eleonore Chiquito MD  CC:  rov  no c/o , pt request something for depression, and BS 14- - 150.  History of Present Illness: 77 year old patient who is seen today for follow-up of her type 2 diabetes.  She has a history of anemia, which has been stable.  She has treated hypertension.  She has a history of a cardiomyopathy and denies any dyspnea on exertion, PND, orthopnea, or peripheral edema.  She has mild DJD, which has been stable.  She has been under considerable stress due to the poor health of her husband and his noncompliance.  She has become tearful, depressed, anxious.  Her last hemoglobin  A1c was 6.0.  Allergies: 1)  ! Sulfa 2)  ! Penicillin G Potassium (Penicillin G Potassium) 3)  ! Streptomycin Sulfate (Streptomycin Sulfate) 4)  ! Buprenex (Buprenorphine Hcl) 5)  ! * Ivp Dye  Past History:  Past Medical History: coronary artery disease status post non-ST segment elevation MI August 2007 (TakTsbubo CM?) HIATAL HERNIA (ICD-553.3) DIVERTICULOSIS OF COLON (ICD-562.10) ARTERIOVENOUS MALFORMATION (ICD-747.60) CHEST PAIN (ICD-786.50) DIVERTICULITIS, ACUTE (ICD-562.11) G I BLEED (ICD-578.9) NEPHROLITHIASIS, HX OF (ICD-V13.01) ANEMIA NEC (ICD-285.8) OSTEOARTHRITIS (ICD-715.90) HYPERLIPIDEMIA (ICD-272.4) 1. Nonobstructive coronary artery disease. 2. 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 3. Ejection fraction 55% by recent echo. 4. Diabetes. 5. Hypertension for hyperlipidemia. 6. IVP DYE allergy.    ALLERGIC RHINITIS, SEASONAL (ICD-477.0) DEGENERATIVE JOINT DISEASE, CERVICAL SPINE (ICD-721.90) MYOCARDIAL INFARCTION, HX OF (ICD-412) HYPERTENSION (ICD-401.9) GERD (ICD-530.81) DIABETES MELLITUS, TYPE II (ICD-250.00) Depression  Review of Systems       The patient complains of depression.  The patient denies anorexia, fever, weight loss, weight gain, vision loss, decreased hearing, hoarseness, chest pain, syncope, dyspnea on exertion, peripheral edema, prolonged cough, headaches, hemoptysis, abdominal pain, melena, hematochezia, severe indigestion/heartburn, hematuria, incontinence, genital sores, muscle weakness, suspicious skin lesions, transient blindness, difficulty walking, unusual weight change, abnormal bleeding, enlarged lymph nodes, angioedema, and breast masses.    Physical Exam  General:  overweight-appearing.  normal blood pressureoverweight-appearing.   Head:  Normocephalic and atraumatic without obvious abnormalities. No apparent alopecia or balding. Eyes:  No corneal or conjunctival inflammation noted. EOMI. Perrla. Funduscopic exam benign, without hemorrhages, exudates or papilledema. Vision grossly normal. Mouth:  Oral mucosa and oropharynx without lesions or exudates.  Teeth in good repair. Neck:  No deformities, masses, or tenderness noted. Lungs:  Normal respiratory effort, chest expands symmetrically. Lungs are clear to auscultation, no crackles or wheezes. Heart:  Normal rate and regular rhythm. S1 and S2 normal without gallop, murmur, click, rub or other extra sounds. Abdomen:  Bowel sounds positive,abdomen soft and non-tender without masses, organomegaly or hernias noted. Msk:  No deformity or scoliosis noted of thoracic or lumbar spine.   Pulses:  R and L carotid,radial,femoral,dorsalis pedis and posterior tibial pulses are full and equal bilaterally Extremities:  No clubbing, cyanosis, edema, or deformity  noted with normal full range of motion of all joints.    Psych:  depressed affect.  depressed affect.     Impression & Recommendations:  Problem # 1:  DEPRESSION (ICD-311)  Her updated medication list for this problem includes:    Trazodone Hcl 100 Mg Tabs (Trazodone hcl) .Marland Kitchen... 1 at bedtime    Citalopram Hydrobromide 20 Mg Tabs (Citalopram hydrobromide) ..... One daily  Her updated medication list for this problem includes:    Trazodone Hcl 100 Mg Tabs (Trazodone hcl) .Marland Kitchen... 1 at bedtime    Citalopram Hydrobromide 20 Mg Tabs (Citalopram hydrobromide) ..... One daily  Problem # 2:  ANEMIA NEC (ICD-285.8)  Her updated medication list for this problem includes:    Ferrous Sulfate 325 (65 Fe) Mg Tbec (Ferrous sulfate) .Marland Kitchen... 1 once daily    Her updated medication list for this problem includes:    Ferrous Sulfate 325 (65 Fe) Mg Tbec (Ferrous sulfate) .Marland Kitchen... 1 once daily  Orders: TLB-CBC Platelet - w/Differential (85025-CBCD)  Problem # 3:  HYPERTENSION (ICD-401.9)  Her updated medication list for this problem includes:    Coreg 6.25 Mg Tabs (Carvedilol) .Marland Kitchen... 1 two times a day    Benicar 20 Mg Tabs (Olmesartan medoxomil) ..... One daily  Her updated medication list for this problem includes:    Coreg 6.25 Mg Tabs (Carvedilol) .Marland Kitchen... 1 two times a day    Benicar 20 Mg Tabs (Olmesartan medoxomil) ..... One daily  Problem # 4:  DIABETES MELLITUS, TYPE II (ICD-250.00)  Her updated medication list for this problem includes:    Adult Aspirin Ec Low Strength 81 Mg Tbec (Aspirin) .Marland Kitchen... 1 once daily    Glucophage Xr 500 Mg Tb24 (Metformin hcl) .Marland Kitchen... 2 at bedtime    Benicar 20 Mg Tabs (Olmesartan medoxomil) ..... One daily    Her updated medication list for this problem includes:    Adult Aspirin Ec Low Strength 81 Mg Tbec (Aspirin) .Marland Kitchen... 1 once daily    Glucophage Xr 500 Mg Tb24 (Metformin hcl) .Marland Kitchen... 2 at bedtime    Benicar 20 Mg Tabs (Olmesartan medoxomil) ..... One daily  Orders: TLB-CBC Platelet - w/Differential  (85025-CBCD) TLB-A1C / Hgb A1C (Glycohemoglobin) (83036-A1C)  Complete Medication List: 1)  Omeprazole 20 Mg Cpdr (Omeprazole) .Marland Kitchen.. 1 once daily 2)  Trazodone Hcl 100 Mg Tabs (Trazodone hcl) .Marland Kitchen.. 1 at bedtime 3)  Centrum Silver Tabs (Multiple vitamins-minerals) .... Take one by mouth daily 4)  Simvastatin 40 Mg Tabs (Simvastatin) .Marland Kitchen.. 1 once daily 5)  Coreg 6.25 Mg Tabs (Carvedilol) .Marland Kitchen.. 1 two times a day 6)  Zolpidem Tartrate 5 Mg Tabs (Zolpidem tartrate) .... One at bedtime if needed for sleep 7)  Freestyle Test Strp (Glucose blood) .... Use as dir 8)  Adult Aspirin Ec Low Strength 81 Mg Tbec (Aspirin) .Marland Kitchen.. 1 once daily 9)  Ferrous Sulfate 325 (65 Fe) Mg Tbec (Ferrous sulfate) .Marland Kitchen.. 1 once daily 10)  Glucophage Xr 500 Mg Tb24 (Metformin hcl) .... 2 at bedtime 11)  Hydrocodone-acetaminophen 5-500 Mg Tabs (Hydrocodone-acetaminophen) .... One every 6 hours as needed for pain 12)  Glucosamine-chondroitin 500-400 Mg Caps (Glucosamine-chondroitin) .Marland Kitchen.. 1 tab daily 13)  Vitamin D 400 Unit Caps (Cholecalciferol) .Marland Kitchen.. 1 tab qd 14)  Vitamin C Cr 1000 Mg Cr-tabs (Ascorbic acid) .Marland Kitchen.. 1 tab qd 15)  Calcium 500 Mg Tabs (Calcium carbonate) .Marland Kitchen.. 1 tab twice a day 16)  Benicar 20 Mg Tabs (Olmesartan medoxomil) .... One daily 17)  Metoclopramide Hcl 5 Mg Tabs (Metoclopramide  hcl) .... 1 three times a day 18)  Citalopram Hydrobromide 20 Mg Tabs (Citalopram hydrobromide) .... One daily  Patient Instructions: 1)  Please schedule a follow-up appointment in 3 months. 2)  It is important that you exercise regularly at least 20 minutes 5 times a week. If you develop chest pain, have severe difficulty breathing, or feel very tired , stop exercising immediately and seek medical attention. 3)  Check your blood sugars regularly. If your readings are usually above : or below 70 you should contact our office. 4)  It is important that your Diabetic A1c level is checked every 3 months. 5)  See your eye doctor yearly to  check for diabetic eye damage. Prescriptions: CITALOPRAM HYDROBROMIDE 20 MG TABS (CITALOPRAM HYDROBROMIDE) one daily  #90 x 3   Entered and Authorized by:   Gordy Savers  MD   Signed by:   Gordy Savers  MD on 10/31/2009   Method used:   Print then Give to Patient   RxID:   [id number]

## 2010-10-30 NOTE — Assessment & Plan Note (Signed)
Summary: pt will come in fasting/njr/PT RESCD//CCM   Vital Signs:  Patient profile:   77 year old female Height:      64 inches Weight:      176 pounds BMI:     30.32 Temp:     98.2 degrees F oral Pulse rate:   64 / minute Resp:     14 per minute BP sitting:   140 / 80  (left arm)  Vitals Entered By: Willy Eddy, LPN (July 24, 2010 9:45 AM) CC: annual visit for disease  managmement Is Patient Diabetic? Yes   Primary Care Provider:  Eleonore Chiquito MD  CC:  annual visit for disease  managmement.  History of Present Illness: 89 patient who is seen today for a health maintenance examination.  She lost her husband recently and the funeral was last week.  She has a history of nonobstructive coronary disease, hypertension, and type 2 diabetes.  Her last hemoglobin A1C6.0.  She has gastroesophageal reflux disease and dyslipidemia  Here for Medicare AWV:  1.   Risk factors based on Past M, S, F history:cardiovascular risk factors include hypertension, dyslipidemia, and remote tobacco use.  She does have a history of nonobstructive coronary artery disease 2.   Physical Activities: remains quite activ;  participates in a senior Olympics 3.   Depression/mood: no history of depression and mood disorder; adjusting to the recent death of her husband 4.   Hearing: no deficits 5.   ADL's: independent in all aspects of daily living 6.   Fall Risk: low 7.   Home Safety: no problem identified  8.   Height, weight, &visual acuity:height and weight stable.  No difficulty with visual acuity receives by annual eye examination 9.   Counseling: heart healthy diet and regular.  Exercise encouraged 10.   Labs ordered based on risk factors: laboratory profile, including lipid panel will be reviewed 11.           Referral Coordination- gynecology referral 12.           Care Plan- patient will have follow up with gynecology next month 13.           Cognitive Assessment- alert and oriented, with  normal affect.  No history of cognitive or memory dysfunction handles all her executive functioning without difficulty   Preventive Screening-Counseling & Management  Alcohol-Tobacco     Smoking Status: quit  Current Problems (verified): 1)  Depression  (ICD-311) 2)  Phlebitis&thrombophleb Sup Veins Upper Extrem  (ICD-451.82) 3)  Tobacco Use, Quit  (ICD-V15.82) 4)  Cellulitis, Hand, Left  (ICD-682.4) 5)  Uri  (ICD-465.9) 6)  Cardiomyopathy, Secondary  (ICD-425.9) 7)  Back Pain, Lumbar  (ICD-724.2) 8)  Rectal Bleeding  (ICD-569.3) 9)  Hiatal Hernia  (ICD-553.3) 10)  Diverticulosis of Colon  (ICD-562.10) 11)  Arteriovenous Malformation  (ICD-747.60) 12)  Chest Pain  (ICD-786.50) 13)  Diverticulitis, Acute  (ICD-562.11) 14)  G I Bleed  (ICD-578.9) 15)  Nephrolithiasis, Hx of  (ICD-V13.01) 16)  Anemia Nec  (ICD-285.8) 17)  Osteoarthritis  (ICD-715.90) 18)  Hyperlipidemia  (ICD-272.4) 19)  Allergic Rhinitis, Seasonal  (ICD-477.0) 20)  Degenerative Joint Disease, Cervical Spine  (ICD-721.90) 21)  Myocardial Infarction, Hx of  (ICD-412) 22)  Hypertension  (ICD-401.9) 23)  Gerd  (ICD-530.81) 24)  Diabetes Mellitus, Type II  (ICD-250.00)  Current Medications (verified): 1)  Omeprazole 20 Mg Cpdr (Omeprazole) .Marland Kitchen.. 1 Once Daily 2)  Trazodone Hcl 100 Mg Tabs (Trazodone Hcl) .Marland Kitchen.. 1 At Bedtime  3)  Centrum Silver   Tabs (Multiple Vitamins-Minerals) .... Take One By Mouth Daily 4)  Simvastatin 40 Mg  Tabs (Simvastatin) .Marland Kitchen.. 1 Once Daily 5)  Carvedilol 12.5 Mg Tabs (Carvedilol) .... Take One Tablet By Mouth Twice A Day 6)  Zolpidem Tartrate 5 Mg  Tabs (Zolpidem Tartrate) .... One At Bedtime If Needed For Sleep 7)  Freestyle Test   Strp (Glucose Blood) .... Use As Dir 8)  Adult Aspirin Ec Low Strength 81 Mg  Tbec (Aspirin) .Marland Kitchen.. 1 Once Daily 9)  Ferrous Sulfate 325 (65 Fe) Mg  Tbec (Ferrous Sulfate) .Marland Kitchen.. 1 Once Daily 10)  Glucophage Xr 500 Mg  Tb24 (Metformin Hcl) .... 2 At Bedtime 11)   Hydrocodone-Acetaminophen 5-500 Mg Tabs (Hydrocodone-Acetaminophen) .... One Every 6 Hours As Needed For Pain 12)  Glucosamine-Chondroitin 500-400 Mg Caps (Glucosamine-Chondroitin) .Marland Kitchen.. 1 Tab Daily 13)  Vitamin D 400 Unit Caps (Cholecalciferol) .Marland Kitchen.. 1 Tab Qd 14)  Vitamin C Cr 1000 Mg Cr-Tabs (Ascorbic Acid) .Marland Kitchen.. 1 Tab Qd 15)  Calcium 500 Mg Tabs (Calcium Carbonate) .Marland Kitchen.. 1 Tab Twice A Day 16)  Benicar 20 Mg Tabs (Olmesartan Medoxomil) .... One Daily 17)  Citalopram Hydrobromide 20 Mg Tabs (Citalopram Hydrobromide) .... One Daily  Allergies (verified): 1)  ! Sulfa 2)  ! Penicillin G Potassium (Penicillin G Potassium) 3)  ! Streptomycin Sulfate (Streptomycin Sulfate) 4)  ! Buprenex (Buprenorphine Hcl) 5)  ! * Ivp Dye  Past History:  Past Medical History: Reviewed history from 10/31/2009 and no changes required. coronary artery disease status post non-ST segment elevation MI August 2007 (TakTsbubo CM?) HIATAL HERNIA (ICD-553.3) DIVERTICULOSIS OF COLON (ICD-562.10) ARTERIOVENOUS MALFORMATION (ICD-747.60) CHEST PAIN (ICD-786.50) DIVERTICULITIS, ACUTE (ICD-562.11) G I BLEED (ICD-578.9) NEPHROLITHIASIS, HX OF (ICD-V13.01) ANEMIA NEC (ICD-285.8) OSTEOARTHRITIS (ICD-715.90) HYPERLIPIDEMIA (ICD-272.4) 1. Nonobstructive coronary artery disease. 2. 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 3. Ejection fraction 55% by recent echo. 4. Diabetes. 5. Hypertension for hyperlipidemia. 6. IVP DYE allergy.  ALLERGIC RHINITIS, SEASONAL (ICD-477.0) DEGENERATIVE JOINT DISEASE, CERVICAL SPINE (ICD-721.90) MYOCARDIAL INFARCTION, HX OF (ICD-412) HYPERTENSION (ICD-401.9) GERD (ICD-530.81) DIABETES MELLITUS, TYPE II (ICD-250.00) Depression  Past Surgical History: Cholecystectomy 99 Hysterectomy 1961,94 kidney stone extraction x 2 status post left knee arthroscopic surgery heart catheterization, 1998, and 2007 lithotripsy  January 2009 cervical diskectomy 00,04 appendectomy   colonoscopy August 2008  Family History: Reviewed history from 09/20/2008 and no changes required. Father died at 82 of an MI  mother at 68 of an MI and had congestive heart failure one brother died at 35 of an MI two sisters, one status post CABG  Family History of Diabetes: Sister, Father Family History of Breast Cancer: Sister, 2 aunts  Social History: Reviewed history from 09/20/2008 and no changes required. Regular exercise-yes Patient is a former smoker.  widowed 06-2010  is a packs of a  Review of Systems  The patient denies anorexia, fever, weight loss, weight gain, vision loss, decreased hearing, hoarseness, chest pain, syncope, dyspnea on exertion, peripheral edema, prolonged cough, headaches, hemoptysis, abdominal pain, melena, hematochezia, severe indigestion/heartburn, hematuria, incontinence, genital sores, muscle weakness, suspicious skin lesions, transient blindness, difficulty walking, depression, unusual weight change, abnormal bleeding, enlarged lymph nodes, angioedema, and breast masses.         Flu Vaccine Consent Questions     Do you have a history of severe allergic reactions to this vaccine? no    Any prior history of allergic reactions to egg and/or gelatin?  no    Do you have a sensitivity to the preservative Thimersol? no    Do you have a past history of Guillan-Barre Syndrome? no    Do you currently have an acute febrile illness? no    Have you ever had a severe reaction to latex? no    Vaccine information given and explained to patient? yes    Are you currently pregnant? no    Lot Number:[id number]   Exp Date:03/30/2011   Site Given  Left Deltoid IM   Physical Exam  General:  overweight-appearing.  130/80overweight-appearing.   Head:  Normocephalic and atraumatic without obvious abnormalities. No apparent alopecia or balding. Eyes:  No corneal or conjunctival inflammation noted. EOMI. Perrla.  Funduscopic exam benign, without hemorrhages, exudates or papilledema. Vision grossly normal. Ears:  External ear exam shows no significant lesions or deformities.  Otoscopic examination reveals clear canals, tympanic membranes are intact bilaterally without bulging, retraction, inflammation or discharge. Hearing is grossly normal bilaterally. Nose:  External nasal examination shows no deformity or inflammation. Nasal mucosa are pink and moist without lesions or exudates. Mouth:  Oral mucosa and oropharynx without lesions or exudates.  Teeth in good repair. Neck:  No deformities, masses, or tenderness noted. Chest Wall:  No deformities, masses, or tenderness noted. Lungs:  Normal respiratory effort, chest expands symmetrically. Lungs are clear to auscultation, no crackles or wheezes. Heart:  Normal rate and regular rhythm. S1 and S2 normal without gallop, murmur, click, rub or other extra sounds. Abdomen:  Bowel sounds positive,abdomen soft and non-tender without masses, organomegaly or hernias noted. Msk:  No deformity or scoliosis noted of thoracic or lumbar spine.   Pulses:  R and L carotid,radial,femoral,dorsalis pedis and posterior tibial pulses are full and equal bilaterally Extremities:  No clubbing, cyanosis, edema, or deformity noted with normal full range of motion of all joints.   Neurologic:  No cranial nerve deficits noted. Station and gait are normal. Plantar reflexes are down-going bilaterally. DTRs are symmetrical throughout. Sensory, motor and coordinative functions appear intact. Skin:  Intact without suspicious lesions or rashes Cervical Nodes:  No lymphadenopathy noted Axillary Nodes:  No palpable lymphadenopathy Inguinal Nodes:  No significant adenopathy Psych:  Cognition and judgment appear intact. Alert and cooperative with normal attention span and concentration. No apparent delusions, illusions, hallucinations  Diabetes Management Exam:    Foot Exam (with socks and/or  shoes not present):       Sensory-Pinprick/Light touch:          Left medial foot (L-4): normal          Left dorsal foot (L-5): normal          Left lateral foot (S-1): normal          Right medial foot (L-4): normal          Right dorsal foot (L-5): normal          Right lateral foot (S-1): normal       Sensory-Monofilament:          Left foot: normal          Right foot: normal       Inspection:          Left foot: normal          Right foot: normal       Nails:          Left foot: normal          Right foot: normal  Foot Exam by Podiatrist:       Date: 07/24/2010       Results: no diabetic findings       Done by: pcp    Eye Exam:       Eye Exam done here today          Results: normal   Impression & Recommendations:  Problem # 1:  Preventive Health Care (ICD-V70.0)  Problem # 2:  HYPERLIPIDEMIA (ICD-272.4)  Her updated medication list for this problem includes:    Simvastatin 40 Mg Tabs (Simvastatin) .Marland Kitchen... 1 once daily    Her updated medication list for this problem includes:    Simvastatin 40 Mg Tabs (Simvastatin) .Marland Kitchen... 1 once daily  Problem # 3:  HYPERTENSION (ICD-401.9)  Her updated medication list for this problem includes:    Carvedilol 12.5 Mg Tabs (Carvedilol) .Marland Kitchen... Take one tablet by mouth twice a day    Benicar 20 Mg Tabs (Olmesartan medoxomil) ..... One daily  Her updated medication list for this problem includes:    Carvedilol 12.5 Mg Tabs (Carvedilol) .Marland Kitchen... Take one tablet by mouth twice a day    Benicar 20 Mg Tabs (Olmesartan medoxomil) ..... One daily  Problem # 4:  DIABETES MELLITUS, TYPE II (ICD-250.00)  Her updated medication list for this problem includes:    Adult Aspirin Ec Low Strength 81 Mg Tbec (Aspirin) .Marland Kitchen... 1 once daily    Glucophage Xr 500 Mg Tb24 (Metformin hcl) .Marland Kitchen... 2 at bedtime    Benicar 20 Mg Tabs (Olmesartan medoxomil) ..... One daily    Her updated medication list for this problem includes:    Adult Aspirin Ec Low  Strength 81 Mg Tbec (Aspirin) .Marland Kitchen... 1 once daily    Glucophage Xr 500 Mg Tb24 (Metformin hcl) .Marland Kitchen... 2 at bedtime    Benicar 20 Mg Tabs (Olmesartan medoxomil) ..... One daily  Complete Medication List: 1)  Omeprazole 20 Mg Cpdr (Omeprazole) .Marland Kitchen.. 1 once daily 2)  Trazodone Hcl 100 Mg Tabs (Trazodone hcl) .Marland Kitchen.. 1 at bedtime 3)  Centrum Silver Tabs (Multiple vitamins-minerals) .... Take one by mouth daily 4)  Simvastatin 40 Mg Tabs (Simvastatin) .Marland Kitchen.. 1 once daily 5)  Carvedilol 12.5 Mg Tabs (Carvedilol) .... Take one tablet by mouth twice a day 6)  Zolpidem Tartrate 5 Mg Tabs (Zolpidem tartrate) .... One at bedtime if needed for sleep 7)  Freestyle Test Strp (Glucose blood) .... Use as dir 8)  Adult Aspirin Ec Low Strength 81 Mg Tbec (Aspirin) .Marland Kitchen.. 1 once daily 9)  Ferrous Sulfate 325 (65 Fe) Mg Tbec (Ferrous sulfate) .Marland Kitchen.. 1 once daily 10)  Glucophage Xr 500 Mg Tb24 (Metformin hcl) .... 2 at bedtime 11)  Hydrocodone-acetaminophen 5-500 Mg Tabs (Hydrocodone-acetaminophen) .... One every 6 hours as needed for pain 12)  Glucosamine-chondroitin 500-400 Mg Caps (Glucosamine-chondroitin) .Marland Kitchen.. 1 tab daily 13)  Vitamin D 400 Unit Caps (Cholecalciferol) .Marland Kitchen.. 1 tab qd 14)  Vitamin C Cr 1000 Mg Cr-tabs (Ascorbic acid) .Marland Kitchen.. 1 tab qd 15)  Calcium 500 Mg Tabs (Calcium carbonate) .Marland Kitchen.. 1 tab twice a day 16)  Benicar 20 Mg Tabs (Olmesartan medoxomil) .... One daily 17)  Citalopram Hydrobromide 20 Mg Tabs (Citalopram hydrobromide) .... One daily 18)  Lorazepam 0.5 Mg Tabs (Lorazepam) .... One twice daily as needed  Other Orders: Flu Vaccine 34yrs + MEDICARE PATIENTS (Q0086) Administration Flu vaccine - MCR (G0008) Medicare -1st Annual Wellness Visit [phone]) Venipuncture (09326) TLB-Lipid Panel (80061-LIPID) TLB-BMP (Basic Metabolic Panel-BMET) (80048-METABOL) TLB-CBC Platelet - w/Differential (85025-CBCD)  TLB-Hepatic/Liver Function Pnl (80076-HEPATIC) TLB-TSH (Thyroid Stimulating Hormone)  (84443-TSH) TLB-A1C / Hgb A1C (Glycohemoglobin) (83036-A1C)  Patient Instructions: 1)  Please schedule a follow-up appointment in 6 months. 2)  Limit your Sodium (Salt). 3)  It is important that you exercise regularly at least 20 minutes 5 times a week. If you develop chest pain, have severe difficulty breathing, or feel very tired , stop exercising immediately and seek medical attention. 4)  You need to lose weight. Consider a lower calorie diet and regular exercise.  5)  Check your blood sugars regularly. If your readings are usually above : or below 70 you should contact our office. 6)  It is important that your Diabetic A1c level is checked every 3 months. 7)  See your eye doctor yearly to check for diabetic eye damage. Prescriptions: LORAZEPAM 0.5 MG TABS (LORAZEPAM) one twice daily as needed  #50 x 0   Entered and Authorized by:   Gordy Savers  MD   Signed by:   Gordy Savers  MD on 07/24/2010   Method used:   Print then Give to Patient   RxID:   [id number] CITALOPRAM HYDROBROMIDE 20 MG TABS (CITALOPRAM HYDROBROMIDE) one daily  #90 x 6   Entered and Authorized by:   Gordy Savers  MD   Signed by:   Gordy Savers  MD on 07/24/2010   Method used:   Electronically to        [facility]  [address] [id number] (retail)       [address]       [place], [place]  [address]       Ph: [phone] or [phone]       Fax: [phone]   RxID:   [id number] BENICAR 20 MG TABS (OLMESARTAN MEDOXOMIL) one daily  #90 Tablet x 6   Entered and Authorized by:   Gordy Savers  MD   Signed by:   Gordy Savers  MD on 07/24/2010   Method used:   Electronically to        [facility]  [address] [id number] (retail)       [address]       [place], [place]  [address]       Ph: [phone] or [phone]       Fax: [phone]   RxID:   [id number] GLUCOPHAGE XR 500 MG  TB24 (METFORMIN HCL) 2 at bedtime  #180 Tablet x 5    Entered and Authorized by:   Gordy Savers  MD   Signed by:   Gordy Savers  MD on 07/24/2010   Method used:   [facility]ronically to        [facility]  [address] [id number] ([facility])       [address]       [place], [place]  [address]       Ph: [phone] or [phone]       Fax: [phone]   RxID:   [id number] CARVEDILOL 12.5 MG TABS (CARVEDILOL) Take one tablet by mouth twice a day  #180 x 11   Entered and Authorized by:   Gordy Savers  MD   Signed by:   Gordy Savers  MD on 07/24/2010   Method used:   [facility]ically to        [facility]  [address] [id number] ([facility]ail[facility]  [address]       [place], [place]  [address]       Ph: [phone] or [phone]       Fax: [phone]   RxID:   [id number] SIMVASTATIN 40 MG  TABS (SIMVASTATIN) 1 once daily  #90 Tablet x 1   Entered and Authorized by:   Gordy Savers  MD   Signed by:   Gordy Savers  MD on 07/24/2010   Method used:   [facility]ronically to        [facility]  [address] [id number] ([facility])       [address]       [place], [place]  [address]       Ph: [phone] or [phone]       Fax: [phone]   RxID:   [id number] TRAZODONE HCL 100 MG TABS (TRAZODONE HCL) 1 at bedtime  #90 Tablet x 6   Entered and Authorized by:   Gordy Savers  MD   Signed by:   Gordy Savers  MD on 07/24/2010   Method used:   [facility]ronically to        [facility]  [address] [id number] ([facility]       [address]       [place], [place]  [address]       Ph: [phone] or [phone]       Fax: [phone]   RxID:   [id number] OMEPRAZOLE 20 MG CPDR (OMEPRAZOLE) 1 once daily  #90 x 5   Entered and Authorized by:   Gordy Savers  MD   Signed by:   Gordy Savers  MD on 07/24/2010   Method used:   [facility]ronically to        [facility]  [address] [id number] ([facility]       [address]       [place], [place]  [address]       Ph: [phone] or [phone]       Fax: [phone]   RxID:   [id number]    Orders Added: 1)  Flu Vaccine 21yrs + MEDICARE PATIENTS [Q2039] 2)  Administration Flu vaccine - MCR [G0008] 3)  Medicare -1st Annual Wellness Visit [G0438] 4)  Venipuncture [37902] 5)  TLB-Lipid Panel [80061-LIPID] 6)  TLB-BMP (Basic Metabolic Panel-BMET) [80048-METABOL] 7)  TLB-CBC Platelet - w/Differential [85025-CBCD] 8)  TLB-Hepatic/Liver Function Pnl [80076-HEPATIC] 9)  TLB-TSH (Thyroid Stimulating Hormone) [84443-TSH] 10)  TLB-A1C / Hgb A1C (Glycohemoglobin) [83036-A1C]     Preventive Care Screening  Mammogram:    Date:  07/28/2009    Next Due:  08/2010    Results:  normal   Pap Smear:    Next Due:  08/2011  Last Flu Shot:    Date:  07/24/2010    Results:  Fluvax 3+  Appended Document: Orders Update     Clinical Lists Changes  Orders: Added new Service order of Specimen Handling (40973) - Signed      Appended Document: pt will come in fasting/njr/PT RESCD//CCM blood sugar was129

## 2010-10-30 NOTE — Assessment & Plan Note (Signed)
Summary: 3 mo rov/jlp/pt rescd//ccm   Vital Signs:  Patient profile:   77 year old female Weight:      179 pounds Temp:     98.1 degrees F oral BP sitting:   122 / 70  (left arm) Cuff size:   regular  Vitals Entered By: Duard Brady LPN (January 18, 2010 10:55 AM) And a well aCC: rov - doing well      fbs 132 Is Patient Diabetic? Yes Did you bring your meter with you today? No   Primary Care Provider:  Eleonore Chiquito MD  CC:  rov - doing well      fbs 132.  History of Present Illness: 77 year old patient who is seen today for follow-up of her type 2 diabetes she has hypertension, gastroesophageal reflux disease, and a history of coronary artery disease.  She is recent cardiology follow-up.  She has done quite well today.  She has maintained nice  Glycemic control.  Her last hemoglobin A1 was  5.9. She has a history of depression, which has done remarkably well with that treatment.  She is quite please with a response she has hypertension well controlled on Benicar.  She has a history of GI bleeding and anemia, which has been stable.  She has osteoarthritis  Allergies: 1)  ! Sulfa 2)  ! Penicillin G Potassium (Penicillin G Potassium) 3)  ! Streptomycin Sulfate (Streptomycin Sulfate) 4)  ! Buprenex (Buprenorphine Hcl) 5)  ! * Ivp Dye  Past History:  Past Medical History: Reviewed history from 10/31/2009 and no changes required. coronary artery disease status post non-ST segment elevation MI August 2007 (TakTsbubo CM?) HIATAL HERNIA (ICD-553.3) DIVERTICULOSIS OF COLON (ICD-562.10) ARTERIOVENOUS MALFORMATION (ICD-747.60) CHEST PAIN (ICD-786.50) DIVERTICULITIS, ACUTE (ICD-562.11) G I BLEED (ICD-578.9) NEPHROLITHIASIS, HX OF (ICD-V13.01) ANEMIA NEC (ICD-285.8) OSTEOARTHRITIS (ICD-715.90) HYPERLIPIDEMIA (ICD-272.4) 1. Nonobstructive coronary artery disease. 2. 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 3. Ejection fraction 55% by recent echo. 4. Diabetes. 5. Hypertension for hyperlipidemia. 6. IVP DYE allergy.  ALLERGIC RHINITIS, SEASONAL (ICD-477.0) DEGENERATIVE JOINT DISEASE, CERVICAL SPINE (ICD-721.90) MYOCARDIAL INFARCTION, HX OF (ICD-412) HYPERTENSION (ICD-401.9) GERD (ICD-530.81) DIABETES MELLITUS, TYPE II (ICD-250.00) Depression  Physical Exam  General:  overweight-appearing.  122/70 Head:  Normocephalic and atraumatic without obvious abnormalities. No apparent alopecia or balding. Eyes:  No corneal or conjunctival inflammation noted. EOMI. Perrla. Funduscopic exam benign, without hemorrhages, exudates or papilledema. Vision grossly normal. Mouth:  Oral mucosa and oropharynx without lesions or exudates.  Teeth in good repair. Neck:  No deformities, masses, or tenderness noted. Lungs:  Normal respiratory effort, chest expands symmetrically. Lungs are clear to auscultation, no crackles or wheezes. Heart:  Normal rate and regular rhythm. S1 and S2 normal without gallop, murmur, click, rub or other extra sounds. Abdomen:  Bowel sounds positive,abdomen soft and non-tender without masses, organomegaly or hernias noted. Msk:  No deformity or scoliosis noted of thoracic or lumbar spine.   Pulses:  R and L carotid,radial,femoral,dorsalis pedis and posterior tibial pulses are full and equal bilaterally Extremities:  No clubbing, cyanosis, edema, or deformity noted with normal full range of motion of all joints.     Impression & Recommendations:  Problem # 1:  DEPRESSION (ICD-311)  Her updated medication list for this problem includes:    Trazodone Hcl 100 Mg Tabs (Trazodone hcl) .Marland Kitchen... 1 at bedtime    Citalopram Hydrobromide 20 Mg Tabs (Citalopram hydrobromide) ..... One daily  Her updated medication  list for this problem includes:    Trazodone Hcl 100 Mg Tabs (Trazodone hcl) .Marland Kitchen... 1 at bedtime    Citalopram Hydrobromide 20 Mg Tabs (Citalopram hydrobromide) ..... One  daily  nice clinical response will continue present treatment  Problem # 2:  CARDIOMYOPATHY, SECONDARY (ICD-425.9) stable recheck in 6 months  Problem # 3:  HYPERLIPIDEMIA (ICD-272.4)  Her updated medication list for this problem includes:    Simvastatin 40 Mg Tabs (Simvastatin) .Marland Kitchen... 1 once daily  Her updated medication list for this problem includes:    Simvastatin 40 Mg Tabs (Simvastatin) .Marland Kitchen... 1 once daily  Problem # 4:  DIABETES MELLITUS, TYPE II (ICD-250.00)  Her updated medication list for this problem includes:    Adult Aspirin Ec Low Strength 81 Mg Tbec (Aspirin) .Marland Kitchen... 1 once daily    Glucophage Xr 500 Mg Tb24 (Metformin hcl) .Marland Kitchen... 2 at bedtime    Benicar 20 Mg Tabs (Olmesartan medoxomil) ..... One daily  Orders: Venipuncture (16109) TLB-A1C / Hgb A1C (Glycohemoglobin) (83036-A1C)  Her updated medication list for this problem includes:    Adult Aspirin Ec Low Strength 81 Mg Tbec (Aspirin) .Marland Kitchen... 1 once daily    Glucophage Xr 500 Mg Tb24 (Metformin hcl) .Marland Kitchen... 2 at bedtime    Benicar 20 Mg Tabs (Olmesartan medoxomil) ..... One daily  Complete Medication List: 1)  Omeprazole 20 Mg Cpdr (Omeprazole) .Marland Kitchen.. 1 once daily 2)  Trazodone Hcl 100 Mg Tabs (Trazodone hcl) .Marland Kitchen.. 1 at bedtime 3)  Centrum Silver Tabs (Multiple vitamins-minerals) .... Take one by mouth daily 4)  Simvastatin 40 Mg Tabs (Simvastatin) .Marland Kitchen.. 1 once daily 5)  Carvedilol 12.5 Mg Tabs (Carvedilol) .... Take one tablet by mouth twice a day 6)  Zolpidem Tartrate 5 Mg Tabs (Zolpidem tartrate) .... One at bedtime if needed for sleep 7)  Freestyle Test Strp (Glucose blood) .... Use as dir 8)  Adult Aspirin Ec Low Strength 81 Mg Tbec (Aspirin) .Marland Kitchen.. 1 once daily 9)  Ferrous Sulfate 325 (65 Fe) Mg Tbec (Ferrous sulfate) .Marland Kitchen.. 1 once daily 10)  Glucophage Xr 500 Mg Tb24 (Metformin hcl) .... 2 at bedtime 11)  Hydrocodone-acetaminophen 5-500 Mg Tabs (Hydrocodone-acetaminophen) .... One every 6 hours as needed for  pain 12)  Glucosamine-chondroitin 500-400 Mg Caps (Glucosamine-chondroitin) .Marland Kitchen.. 1 tab daily 13)  Vitamin D 400 Unit Caps (Cholecalciferol) .Marland Kitchen.. 1 tab qd 14)  Vitamin C Cr 1000 Mg Cr-tabs (Ascorbic acid) .Marland Kitchen.. 1 tab qd 15)  Calcium 500 Mg Tabs (Calcium carbonate) .Marland Kitchen.. 1 tab twice a day 16)  Benicar 20 Mg Tabs (Olmesartan medoxomil) .... One daily 17)  Citalopram Hydrobromide 20 Mg Tabs (Citalopram hydrobromide) .... One daily  Patient Instructions: 1)  Please schedule a follow-up appointment in 6 months for CPX 2)  Limit your Sodium (Salt) to less than 2 grams a day(slightly less than 1/2 a teaspoon) to prevent fluid retention, swelling, or worsening of symptoms. 3)  It is important that you exercise regularly at least 20 minutes 5 times a week. If you develop chest pain, have severe difficulty breathing, or feel very tired , stop exercising immediately and seek medical attention. 4)  You need to lose weight. Consider a lower calorie diet and regular exercise.  5)  Check your blood sugars regularly. If your readings are usually above : or below 70 you should contact our office. 6)  It is important that your Diabetic A1c level is checked every 3 months.

## 2010-10-30 NOTE — Progress Notes (Signed)
Summary: BP readings/ refill  Medications Added CARVEDILOL 12.5 MG TABS (CARVEDILOL) Take one tablet by mouth twice a day       Phone Note Call from Patient   Caller: Patient Summary of Call: The pt called today to stating that she has been tracking her bp readings at home, but she thinks she needs a new BP monitor b/c the one she has is probably around 75 years old. Her readings have been running 157-70, 161/106, and today 152/65. The pt's husband's home health nurse checked the pt's bp right after the pt took it with her cuff and the nurse got a manuel reading of 132/68. The pt states she will be getting a new bp cuff. I have instructed her to let me know if she is still getting elevated readings on the new cuff. The pt is agreeable. Initial call taken by: Sherri Rad, RN, BSN,  December 05, 2009 10:21 AM    New/Updated Medications: CARVEDILOL 12.5 MG TABS (CARVEDILOL) Take one tablet by mouth twice a day Prescriptions: CARVEDILOL 12.5 MG TABS (CARVEDILOL) Take one tablet by mouth twice a day  #60 x 11   Entered by:   Sherri Rad, RN, BSN   Authorized by:   Lenoria Farrier, MD, [facility]   Signed by:   Sherri Rad, RN, BSN on 12/05/2009   Method used:   [facility]ronically to        [facility]  [address] [id number]       [address]       [place], [place]  [address]       Ph: [phone] or [phone]       Fax: [phone]   RxID:   [id number]

## 2010-10-30 NOTE — Progress Notes (Signed)
Summary: Elevated BP    Phone Note Outgoing Call   Call placed by: Sherri Rad, RN, BSN,  November 23, 2009 9:50 AM Call placed to: Patient Summary of Call: I called the pt with her echo results. She made me aware that her BP has been in the 170's/ 60-80's for the past week. She states she has been under a great deal of stress with her husband. She has had some chest tightness this week, but no headaches. The pt has not recorded any heart rates. She is taking coreg 6.25mg  two times a day and benicar 20mg  once daily. I explained to the pt that I will discuss with Dr. Juanda Chance when he is in the office this afternoon to see if he wants to adjust her medications vs. have her keep further record of her BP readings. I will call her back later today. The pt is aware and agreeable. Sherri Rad, RN, BSN  November 23, 2009 9:54 AM   I discussed the above with Dr. Juanda Chance, we will increase the pt's Carvedilol to 12.5mg  two times a day. I have called and explained this to the pt. She states that the home health nurse checked her BP today when they were there with her husband and her SBP was in the 140's. I explained this is still a little higher than what we would like to see. The pt will go ahead and adjust her dose of carvedilol. She will call me before she gets it filled again to let me know how she is doing. If her pressure is well controlled, we will call in a 12.5mg  tab to take two times a day. The pt verbalizes understanding. Initial call taken by: Sherri Rad, RN, BSN,  November 23, 2009 6:04 PM

## 2010-10-30 NOTE — Progress Notes (Signed)
  Phone Note From Other Clinic   Summary of Call: lab called a nd states her potasium is 6.2  Initial call taken by: Willy Eddy, LPN,  July 24, 2010 1:58 PM  Follow-up for Phone Call        please ask patient to return today to recheck potassium level if possible Follow-up by: Gordy Savers  MD,  July 25, 2010 8:09 AM  Additional Follow-up for Phone Call Additional follow up Details #1::        pt informed and will come today for repeat potssium Additional Follow-up by: Willy Eddy, LPN,  July 25, 2010 8:17 AM

## 2010-11-01 NOTE — Progress Notes (Signed)
Summary: Pt called req med for cold. [facility] [address]  Phone Note Call from Patient Call back at Work Phone [phone]   Caller: Patient Summary of Call: Pt called and said that she has a head cold. She has cough, head and chest congestion and sorethroat, no fever. Pt is req a [facility] [address] [phone] Initial call taken by: Lucy Antigua,  October 10, 2010 1:18 PM  Follow-up for Phone Call        suggest robitussin dm  one tsp every 6 hours  Follow-up by: Gordy Savers  MD,  October 11, 2010 12:24 PM  Additional Follow-up for Phone Call Additional follow up Details #1::        called pt with above instructions. KIK Additional Follow-up by: Duard Brady LPN,  October 11, 2010 1:15 PM

## 2010-11-08 ENCOUNTER — Other Ambulatory Visit: Payer: Self-pay | Admitting: Internal Medicine

## 2010-11-08 DIAGNOSIS — F419 Anxiety disorder, unspecified: Secondary | ICD-10-CM

## 2010-11-19 ENCOUNTER — Encounter: Payer: Self-pay | Admitting: Cardiology

## 2010-11-19 ENCOUNTER — Ambulatory Visit (INDEPENDENT_AMBULATORY_CARE_PROVIDER_SITE_OTHER): Payer: Medicare Other | Admitting: Cardiology

## 2010-11-19 ENCOUNTER — Ambulatory Visit (HOSPITAL_COMMUNITY): Payer: Medicare Other | Attending: Cardiology

## 2010-11-19 DIAGNOSIS — I2510 Atherosclerotic heart disease of native coronary artery without angina pectoris: Secondary | ICD-10-CM

## 2010-11-19 DIAGNOSIS — E119 Type 2 diabetes mellitus without complications: Secondary | ICD-10-CM | POA: Insufficient documentation

## 2010-11-19 DIAGNOSIS — I428 Other cardiomyopathies: Secondary | ICD-10-CM | POA: Insufficient documentation

## 2010-11-19 DIAGNOSIS — E785 Hyperlipidemia, unspecified: Secondary | ICD-10-CM | POA: Insufficient documentation

## 2010-11-19 DIAGNOSIS — Z8249 Family history of ischemic heart disease and other diseases of the circulatory system: Secondary | ICD-10-CM | POA: Insufficient documentation

## 2010-11-19 DIAGNOSIS — I5022 Chronic systolic (congestive) heart failure: Secondary | ICD-10-CM

## 2010-11-19 DIAGNOSIS — Z87891 Personal history of nicotine dependence: Secondary | ICD-10-CM | POA: Insufficient documentation

## 2010-11-19 DIAGNOSIS — I10 Essential (primary) hypertension: Secondary | ICD-10-CM | POA: Insufficient documentation

## 2010-11-27 ENCOUNTER — Other Ambulatory Visit: Payer: Self-pay | Admitting: Internal Medicine

## 2010-11-27 NOTE — Assessment & Plan Note (Signed)
Summary: F/U 1 YR WITH DR Shihab States PER DR BRODIE/AMD  Medications Added NATURAL VITAMIN E 400 UNIT CAPS (VITAMIN E) 2 tabs by mouth daily FISH OIL 1000 MG CAPS (OMEGA-3 FATTY ACIDS) one twice a day        Primary Provider:  Eleonore Chiquito MD   History of Present Illness: 77 yo with diabetes, HTN, and hyperlipidemia as well as history of presumed Takotsubo cardiomyopathy presents for cardiology followup.  She is doing well in general.  Unfortunately, her husband who had been chronically ill died in 08/08/23.  Symptomatically, she has had no chest pain and has no significant exertional dyspnea when walking on flat ground or one flight of steps, when bowling, or with housework.  She does get short of breath when she tries to run.  I reviewed her echo today which showed EF 50-55% with low normal to mildly decreased systolic function.  No regional wall motion abnormalities and no significant valvular abnormalities.    ECG: NSR at 49, incomplete RBBB, anterolateral ST depression (similar to prior study).    Labs August 08, 2023): K 4.7, creatinine 0.8, LDL 86, HDL 49, TSH normal  Current Medications (verified): 1)  Omeprazole 20 Mg Cpdr (Omeprazole) .Marland Kitchen.. 1 Once Daily 2)  Trazodone Hcl 100 Mg Tabs (Trazodone Hcl) .Marland Kitchen.. 1 At Bedtime 3)  Centrum Silver   Tabs (Multiple Vitamins-Minerals) .... Take One By Mouth Daily 4)  Simvastatin 40 Mg  Tabs (Simvastatin) .Marland Kitchen.. 1 Once Daily 5)  Carvedilol 12.5 Mg Tabs (Carvedilol) .... Take One Tablet By Mouth Twice A Day 6)  Zolpidem Tartrate 5 Mg  Tabs (Zolpidem Tartrate) .... One At Bedtime If Needed For Sleep 7)  Freestyle Test   Strp (Glucose Blood) .... Use As Dir 8)  Adult Aspirin Ec Low Strength 81 Mg  Tbec (Aspirin) .Marland Kitchen.. 1 Once Daily 9)  Ferrous Sulfate 325 (65 Fe) Mg  Tbec (Ferrous Sulfate) .Marland Kitchen.. 1 Once Daily 10)  Glucophage Xr 500 Mg  Tb24 (Metformin Hcl) .... 2 At Bedtime 11)  Glucosamine-Chondroitin 500-400 Mg Caps (Glucosamine-Chondroitin) .Marland Kitchen.. 1 Tab  Daily 12)  Vitamin D 400 Unit Caps (Cholecalciferol) .Marland Kitchen.. 1 Tab Qd 13)  Vitamin C Cr 1000 Mg Cr-Tabs (Ascorbic Acid) .Marland Kitchen.. 1 Tab Qd 14)  Calcium 500 Mg Tabs (Calcium Carbonate) .Marland Kitchen.. 1 Tab Twice A Day 15)  Benicar 20 Mg Tabs (Olmesartan Medoxomil) .... One Daily 16)  Citalopram Hydrobromide 20 Mg Tabs (Citalopram Hydrobromide) .... One Daily 17)  Lorazepam 0.5 Mg Tabs (Lorazepam) .... One Twice Daily As Needed 18)  Natural Vitamin E 400 Unit Caps (Vitamin E) .... 2 Tabs By Mouth Daily  Allergies: 1)  ! Sulfa 2)  ! Penicillin G Potassium (Penicillin G Potassium) 3)  ! Streptomycin Sulfate (Streptomycin Sulfate) 4)  ! Buprenex (Buprenorphine Hcl) 5)  ! * Ivp Dye  Past History:  Past Medical History: 1. Takotsubo cardiomyopathy: Sister died in 05-04-2023 and patient developed CP with ECG changes and evidence for NSTEMI.  LHC showed mild nonobstructive CAD with mildly decreased LV systolic function.  Echo in 2/11 showed EF 40-45% with diffuse hypokinesis. Echo (2/12) showed EF 50-55% with no significant valvular abnormalities and normal RV.  2.  LHC 2023/05/04): Luminal irregularities.  3. HIATAL HERNIA (ICD-553.3) and GERD 4. DIVERTICULOSIS OF COLON (ICD-562.10): History of GI bleeding. 5. ARTERIOVENOUS MALFORMATION (ICD-747.60) 6. NEPHROLITHIASIS, HX OF (ICD-V13.01) 7. ANEMIA NEC (ICD-285.8) 8. OSTEOARTHRITIS (ICD-715.90) 9. Hyperlipidemia  10. Diabetes. 11. HTN 12. ALLERGIC RHINITIS, SEASONAL (ICD-477.0) 13. DEGENERATIVE JOINT DISEASE, CERVICAL SPINE (ICD-721.90) 14. Depression  Family History: Reviewed history from 11/16/2010 and no changes required. Father died at 44 of an MI  mother at 50 of an MI and had congestive heart failure one brother died at 18 of an MI two sisters, one status post CABG Family History of Diabetes: Sister,  Father Family History of Breast Cancer: Sister, 2 aunts  Social History: Reviewed history from 11/16/2010 and no changes required. Regular exercise-yes Patient is a former smoker.  widowed 06-2010 Works for [employer], [employer], broadcasting/film/video games  Review of Systems       All systems reviewed and negative except as per HPI.   Vital Signs:  Patient profile:   77 year old female Height:      64 inches Weight:      174 pounds BMI:     29.97 Pulse rate:   59 / minute Resp:     14 per minute BP sitting:   135 / 65  (left arm)  Vitals Entered By: Kem Parkinson (November 19, 2010 9:30 AM)  Physical Exam  General:  Well developed, well nourished, in no acute distress. Neck:  Neck supple, no JVD. No masses, thyromegaly or abnormal cervical nodes. Lungs:  Clear bilaterally to auscultation and percussion. Heart:  Non-displaced PMI, chest non-tender; regular rate and rhythm, S1, S2 without murmurs, rubs or gallops. Carotid upstroke normal, no bruit. Pedals normal pulses. No edema, no varicosities. Abdomen:  Bowel sounds positive; abdomen soft and non-tender without masses, organomegaly, or hernias noted. No hepatosplenomegaly. Extremities:  No clubbing or cyanosis. Neurologic:  Alert and oriented x 3. Psych:  Normal affect.   Impression & Recommendations:  Problem # 1:  CARDIOMYOPATHY, SECONDARY (ICD-425.9) Patient has a history of probable Takotsubo cardiomyopathy.  EF has increased back to 50-55% on most recent echo today.  I will continue the patient's Coreg and ARB at the same doses.  NYHA class II symptoms.    Problem # 2:  HYPERLIPIDEMIA (ICD-272.4) Lipids under reasonable control when checked in 10/11.   Patient Instructions: 1)  Your physician has recommended you make the following change in your medication:  2)  Start Fish Oil 2000mg  daily--take 1000mg  twice a day. 3)  Your physician wants you to follow-up in: 1 year with Dr Shirlee Latch.(February 2013)  You will receive  a reminder letter in the mail two months in advance. If you don't receive a letter, please call our office to schedule the follow-up appointment.

## 2010-11-29 ENCOUNTER — Encounter: Payer: Self-pay | Admitting: Internal Medicine

## 2010-11-29 ENCOUNTER — Ambulatory Visit (INDEPENDENT_AMBULATORY_CARE_PROVIDER_SITE_OTHER): Payer: Medicare Other | Admitting: Internal Medicine

## 2010-11-29 DIAGNOSIS — M778 Other enthesopathies, not elsewhere classified: Secondary | ICD-10-CM | POA: Insufficient documentation

## 2010-11-29 DIAGNOSIS — E119 Type 2 diabetes mellitus without complications: Secondary | ICD-10-CM

## 2010-11-29 DIAGNOSIS — M6580 Other synovitis and tenosynovitis, unspecified site: Secondary | ICD-10-CM

## 2010-11-29 DIAGNOSIS — I10 Essential (primary) hypertension: Secondary | ICD-10-CM

## 2010-11-29 DIAGNOSIS — M7710 Lateral epicondylitis, unspecified elbow: Secondary | ICD-10-CM

## 2010-11-29 LAB — HEMOGLOBIN A1C: Hgb A1c MFr Bld: 6.2 % (ref 4.6–6.5)

## 2010-11-29 NOTE — Progress Notes (Signed)
  Subjective:    Patient ID: Leslie [name], female    DOB: 09-28-1933, 77 y.o.   MRN: [id number]  HPI  77 year old patient who is seen today for followup of her type 2 diabetes. A random blood sugar 108. She feels her glycemic control is stable. Medical regimen includes metformin.  She has treated hypertension which has been controlled on Coreg and Benicar. She does have a history of coronary artery disease and cardiomyopathy.  Her chief complaint today is right shoulder pain that has large resolved. She now has significant right lateral elbow pain. Pain is aggravated by movement and heavy lifting. She states she has a difficult time   even holding a cup of coffee. 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  Review of Systems  Constitutional: Negative.   HENT: Negative for hearing loss, congestion, sore throat, rhinorrhea, dental problem, sinus pressure and tinnitus.   Eyes: Negative for pain, discharge and visual disturbance.  Respiratory: Negative for cough and shortness of breath.   Cardiovascular: Negative for chest pain, palpitations and leg swelling.  Gastrointestinal: Negative for nausea, vomiting, abdominal pain, diarrhea, constipation, blood in stool and abdominal distention.  Genitourinary: Negative for dysuria, urgency, frequency, hematuria, flank pain, vaginal bleeding, vaginal discharge, difficulty urinating, vaginal pain and pelvic pain.  Musculoskeletal: Positive for arthralgias ( Considerable right elbow tenderness laterally ). Negative for joint swelling and gait problem.  Skin: Negative for rash.  Neurological: Negative for dizziness, syncope, speech difficulty, weakness, numbness and headaches.  Hematological: Negative for adenopathy.  Psychiatric/Behavioral: Negative for behavioral problems, dysphoric mood and agitation. The patient is not nervous/anxious.        Objective:   Physical Exam  Constitutional: She is oriented to person, place,  and time. She appears well-developed and well-nourished.  HENT:  Head: Normocephalic.  Right Ear: External ear normal.  Left Ear: External ear normal.  Mouth/Throat: Oropharynx is clear and moist.  Eyes: Conjunctivae and EOM are normal. Pupils are equal, round, and reactive to light.  Neck: Normal range of motion. Neck supple. No thyromegaly present.  Cardiovascular: Normal rate, regular rhythm, normal heart sounds and intact distal pulses.   Pulmonary/Chest: Effort normal and breath sounds normal.  Abdominal: Soft. Bowel sounds are normal. She exhibits no mass. There is no tenderness.  Musculoskeletal: Normal range of motion. She exhibits tenderness.        Considerable tenderness was noted involving the right lateral area.  Procedure note- after Betadine prep the right lateral elbow region was injected at the tendon insertion site with 1 cc a 1% Xylocaine and 40 milligrams of Depo-Medrol.  The area was dressed  Lymphadenopathy:    She has no cervical adenopathy.  Neurological: She is alert and oriented to person, place, and time.  Skin: Skin is warm and dry. No rash noted.  Psychiatric: She has a normal mood and affect. Her behavior is normal.          Assessment & Plan:   right elbow epicondylitis- status post  Injection with Xylocaine/ Depo-Medrol  diabetes mellitus- check a hemoglobin A1c  Hypertension stable   Medications refilled. Return her next visit for a annual exam  With  comprehensive lab

## 2010-11-29 NOTE — Patient Instructions (Signed)
Limit your sodium (Salt) intake    It is important that you exercise regularly, at least 20 minutes 3 to 4 times per week.  If you develop chest pain or shortness of breath seek  medical attention.   Please check your hemoglobin A1c every 3 months  Return in 3 months for follow-up   

## 2011-01-14 ENCOUNTER — Ambulatory Visit: Payer: Self-pay | Admitting: Internal Medicine

## 2011-01-21 ENCOUNTER — Ambulatory Visit: Payer: Self-pay | Admitting: Internal Medicine

## 2011-01-28 ENCOUNTER — Ambulatory Visit (INDEPENDENT_AMBULATORY_CARE_PROVIDER_SITE_OTHER): Payer: Medicare Other | Admitting: Internal Medicine

## 2011-01-28 ENCOUNTER — Encounter: Payer: Self-pay | Admitting: Internal Medicine

## 2011-01-28 DIAGNOSIS — L03119 Cellulitis of unspecified part of limb: Secondary | ICD-10-CM

## 2011-01-28 DIAGNOSIS — L03113 Cellulitis of right upper limb: Secondary | ICD-10-CM

## 2011-01-28 DIAGNOSIS — I10 Essential (primary) hypertension: Secondary | ICD-10-CM

## 2011-01-28 DIAGNOSIS — E119 Type 2 diabetes mellitus without complications: Secondary | ICD-10-CM

## 2011-01-28 MED ORDER — CEPHALEXIN 500 MG PO CAPS: 500.0000 mg | ORAL_CAPSULE | Freq: Four times a day (QID) | ORAL | Status: AC

## 2011-01-28 NOTE — Progress Notes (Signed)
  Subjective:    Patient ID: Leslie [name], female    DOB: Oct 01, 1932, 77 y.o.   MRN: [id number]  HPI 77 year old patient who presents with a three-day history of increasing pain and swelling involving her right thumb. This initially began near the dorsal aspect of the distal thumb near the nailbed. The swelling redness and discomfort has spread proximally to the level of the wrist denies any fever chills or constitutional complaints. She has been treated for cellulitis involving the left hand in the past. Allergies include penicillin and sulfa    Review of Systems  Skin: Positive for rash.       Objective:   Physical Exam  Constitutional: She appears well-nourished. No distress.  Skin:       The dorsal aspect of the right was erythematous and slightly swollen this extended the just proximal to the first MCP joint          Assessment & Plan:

## 2011-01-28 NOTE — Patient Instructions (Signed)
Keep right hand elevated as much as possible Take your antibiotic as prescribed until ALL of it is gone, but stop if you develop a rash, swelling, or any side effects of the medication.  Contact our office as soon as possible if  there are side effects of the medication.  Call or return to clinic prn if these symptoms worsen or fail to improve as anticipated.

## 2011-02-08 ENCOUNTER — Other Ambulatory Visit: Payer: Self-pay | Admitting: Internal Medicine

## 2011-02-12 NOTE — H&P (Signed)
Leslie [name], Leslie [name]              ACCOUNT NO.:  [id number]   MEDICAL RECORD NO.:  [id number]          PATIENT TYPE:  INP   LOCATION:  5714                         FACILITY:  [facility]   PHYSICIAN:  Gordy Savers, MDDATE OF BIRTH:  [birth year]   DATE OF ADMISSION:  05/06/2007  DATE OF DISCHARGE:                              HISTORY & PHYSICAL   CHIEF COMPLAINT:  Weakness.   HISTORY OF PRESENT ILLNESS:  The patient is a 77 year old white female  with a history of gastroesophageal reflux disease.  She presented to the  office on the day of admission with a 2-day history of pica, with severe  craving of ice.  This had been present for approximately 2 months, and  over this period of time had noted progressive weakness.  In the office,  she was noted to be pale, and laboratory studies were obtained.  This  subsequently revealed a hemoglobin of 5.8 g %.  The patient is now  admitted for further evaluation and treatment of her severe anemia.   PAST MEDICAL HISTORY:  1. The patient has a history of gastroesophageal flexed disease, but      no prior history of GI bleeding.  She did have a colonoscopy      performed in July 2003 that revealed diverticular disease only.  2. She has a history of coronary artery disease and was admitted to      hospital in August 2007.  At that time, she had a non-ST segment      elevated MI, and subsequent heart catheterization revealed minimal      coronary artery disease.  She had an ejection fraction of 45%-50%.      It was felt that the patient probably had Takotsubo's      cardiomyopathy associated with severe situational stress at that      time.  Since her acute coronary syndrome, she has been on both      aspirin and Plavix.  3. Type 2 diabetes.  4. Hypertension.  5. Dyslipidemia.  6. She has history of osteoarthritis.  7. Seasonal allergic rhinitis.   SURGICAL HISTORY:  1. Cholecystectomy in 1999.  2. Hysterectomy and bilateral  salpingo-oophorectomy in 1994.  3. She has had surgery for kidney stones on two occasions.  4. In 2076m she underwent an anterior cervical diskectomy.   ALLERGIES:  1. IVP DYE.  2. PENICILLIN.  3. SULFA.   PRESENT MEDICAL REGIMEN:  1. Simvastatin 40 mg daily.  2. Aspirin 325 daily.  3. Plavix 75 mg daily.  4. Benazepril 20 mg daily.  5. Coreg 6.25 b.i.d.  6. Metformin 500 mg at bedtime.  7. Trazodone 100 mg at bedtime.   FAMILY HISTORY:  Father died of an MI at age 59.  Mother died at 62 of  complications of coronary disease and congestive heart failure.  One  brother died at 65 of an MI.   REVIEW OF SYSTEMS:  Otherwise fairly noncontributory.  The patient  denies any abdominal pain or any significant change in her bowel habits.  When questioned, she felt perhaps her stool had been  slightly darker  over the past several weeks, but denied any real frank melena.   PHYSICAL EXAMINATION:  GENERAL:  Elderly white female who is pale but in  no acute distress.  VITAL SIGNS:  Unremarkable.  Blood pressure 130/72, pulse 72,  respiratory rate normal.  SKIN:  Unremarkable, except for the paleness.  HEAD/NECK:  Normal pupillary responses.  Conjunctivae clear.  ENT  negative.  Neck revealed no bruits.  There was the presence of a  transmitted murmur.  CHEST:  Clear.  CARDIOVASCULAR:  Revealed a grade 2/6 systolic murmur, loudest at the  base.  ABDOMEN:  Soft and nontender.  Bowel sounds were active.  No  organomegaly noted.  MUSCULOSKELETAL:  Negative.  EXTREMITIES:  Unremarkable, without edema.  Peripheral pulses were  diminished, except for the left posterior tibial pulse.  NEUROLOGIC:  Negative.   IMPRESSION:  1. Severe anemia, presumably iron deficiency anemia secondary to upper      gastrointestinal bleeding associated with aspirin and Plavix use.      Multiple other possibilities.  2. Nonobstructive coronary artery disease.  3. Type 2 diabetes.  4. Hypertension.  5.  Dyslipidemia.   DISPOSITION:  Will admit the patient to the hospital.  Will place on IV  fluids with normal saline.  Blood will be typed and crossed, and the  patient will be transfused 2 units of packed RBCs.  She will be placed  on a clear liquid diet and will be seen in consultation by the GI  service.  The patient will be considered for upper panendoscopy in the  morning.  Her aspirin and Plavix will be held.  Serial blood sugars will  be monitored, and the patient will be covered with a sliding scale  regimen of short-acting insulin.      Gordy Savers, MD  Electronically Signed     PFK/MEDQ  D:  05/06/2007  T:  05/07/2007  Job:  [id number]

## 2011-02-12 NOTE — Discharge Summary (Signed)
Leslie [name], Leslie [name]              ACCOUNT NO.:  [id number]   MEDICAL RECORD NO.:  [id number]          PATIENT TYPE:  INP   LOCATION:  5714                         FACILITY:  [facility]   PHYSICIAN:  Valerie A. Felicity Coyer, MDDATE OF BIRTH:  1932/11/14   DATE OF ADMISSION:  05/06/2007  DATE OF DISCHARGE:                               DISCHARGE SUMMARY   DISCHARGE DIAGNOSES:  1. Severe acute-on-chronic iron deficiency anemia, heme-positive, felt      to be secondary to bleeding arteriovenous malformations in the      presence of chronic Plavix and aspirin use.  2. History of nonobstructive coronary artery disease.  3. Diabetes type 2.  4. Hypertension.  5. Dyslipidemia.  6. Gastroesophageal reflux disease.   HISTORY OF PRESENT ILLNESS:  Leslie [name] is a 77 year old white female  who was admitted on May 06, 2007, with complaints of pica for 2  months.  She noted, in addition, progressive weakness.  Lab studies were  obtained and revealed a severe anemia with a hemoglobin of 5.8.  She was  admitted for blood transfusion and further evaluation of anemia.   PAST MEDICAL HISTORY:  1. Coronary artery disease.  2. Diabetes type 2.  3. Hypertension.  4. Dyslipidemia.  5. Degenerative joint disease.  6. Diverticulosis.  7. GERD.   COURSE OF HOSPITALIZATION:  SEVERE IRON-DEFICIENCY ANEMIA.  The patient  was admitted and was noted to have hemoglobin of 5.8.  She was  transfused a total of 4 units of packed red blood cells during this  admission.  Hemoglobin at time of discharge is 11.7.  GI consult was  requested during this admission.  The patient was seen by Dr. Rob Bunting of [facility].  She subsequently underwent an endoscopy which was  performed on May 07, 2007, which noted a single small duodenal AVM.  This was followed up by a colonoscopy which was normal.  It was felt  that the patient may have more diffuse AVMs in the small intestine that  intermittently bleed.  It was  recommended that she remain off aspirin  and Plavix.  Per GI, as she does not have a coronary stent, it was felt  that perhaps one or both of these medications can be held permanently.  Will defer to the patient's primary care.  As she has had no overt  bleeding it was felt that the patient was safe for discharge.  A capsule  endoscopy has been scheduled to further evaluate the small intestine for  AVM on May 18, 2007, per  GI.   DISPOSITION:  The patient will be discharged to home.   FOLLOWUP:  The patient is instructed to go to [facility] on  Monday, May 11, 2007, for a CBC.  This is to be followed by a full  appointment on May 20, 2007, with Dr. Eleonore Chiquito.  She has  been started on an oral iron supplement during this admission for severe  iron deficiency.   PERTINENT LABORATORIES AT TIME OF DISCHARGE:  Hemoglobin 11.7.   FOLLOWUP:  The patient is scheduled to follow up with Dr.  Eleonore Chiquito on May 20, 2007, at 11:30 a.m.  In addition, she is  scheduled for a capsule endoscopy on August 19 at 8 a.m.  She is also  being given a prescription for a CBC check at [facility] on  May 11, 2007.      Sandford Craze, NP      Raenette Rover. Felicity Coyer, MD  Electronically Signed    MO/MEDQ  D:  05/08/2007  T:  05/09/2007  Job:  [id number]   cc:   Gordy Savers, MD

## 2011-02-12 NOTE — Assessment & Plan Note (Signed)
[facility]                            CARDIOLOGY OFFICE NOTE   ELEANA, TOCCO                     MRN:          [id number]  DATE:09/08/2007                            DOB:          07/26/1933    PRIMARY CARE PHYSICIAN:  Dr. Derryl Harbor.   PAST MEDICAL HISTORY:  Leslie [name] is 77 years old and returned for  management of her coronary heart disease.  She was admitted with a non-  ST-elevation infarction in July 2007 and was found to have minimal  nonobstructive disease, and had a wall motion abnormality consistent  with Tako-Tsubo cardiomyopathy.  Her ejection fraction was 45-50%.  She  has done quite well since that time with her heart.  Has had no chest  pain, shortness of breath, or palpitations.   PAST MEDICAL HISTORY:  Significant for recent hospitalization for GI  bleed requiring 4 units of blood.  She was seen by Dr. Leone Payor in the  hospital and has subsequently seen Dr. [name] for this.  They have  not found any definite cause for her bleed, and I believe they think it  is related to arteriovenous malformations.   Her past medical history is also significant for hypertension, diabetes,  and hyperlipidemia.   Her family history is positive in that she has multiple males with  cardiac disease.  She has a sister who has had multiple stents.   SOCIAL HISTORY:  She has worked with the [employer] before.   CURRENT MEDICATIONS:  Benazepril.  Coreg.  Aspirin.  Metformin.  Trazodone.  Simvastatin.  Prilosec.   EXAMINATION:  Blood pressure is 166/73 and pulse is 66 and regular.  She  indicated blood pressures in the 130 range at home.  There was no venous distension.  Carotid pulses were full without  bruits.  CHEST:  Clear.  CARDIAC:  Rhythm was regular.  I could hear no murmurs or gallops.  ABDOMEN:  Soft with normal bowel sounds.  There is no  hepatosplenomegaly.  Peripheral pulses full and there was no peripheral  edema.   An electrocardiogram showed nonspecific ST-T wave changes and incomplete  right bundle branch block.   IMPRESSION:  1. Nonobstructive coronary artery disease.  2. History of non-ST-elevation myocardial infarction thought to be      related to Tako-Tsubo cardiomyopathy July 2007.  3. Ejection fraction 55% by recent echo.  4. Diabetes.  5. Hypertension for hyperlipidemia.  6. IVP DYE allergy.   RECOMMENDATIONS:  I think Ms. Hartman is doing quite well.  The main  issues are preventative at this point.  She is quite concerned because  of her family history and wants to keep a close touch with her heart  problems, so we will plan to see her back in followup in a year or  sooner if she has any cardiac difficulties.     Bruce Elvera Lennox Juanda Chance, MD, [facility]  Electronically Signed    BRB/MedQ  DD: 09/08/2007  DT: 09/08/2007  Job #: [id number]

## 2011-02-12 NOTE — Op Note (Signed)
NAMECHARLENE, Leslie [name]              ACCOUNT NO.:  [id number]   MEDICAL RECORD NO.:  [id number]          PATIENT TYPE:  INP   LOCATION:  3003                         FACILITY:  [facility]   PHYSICIAN:  Stefani Dama, M.D.  DATE OF BIRTH:  11/21/1932   DATE OF PROCEDURE:  09/29/2008  DATE OF DISCHARGE:                               OPERATIVE REPORT   PREOPERATIVE DIAGNOSES:  T12 compression fracture secondary to trauma  and osteoporosis.   POSTOPERATIVE DIAGNOSES:  T12 compression fracture secondary to trauma  and osteoporosis.   PROCEDURES:  1. T12 vertebral body biopsy.  2. Acrylic balloon kyphoplasty.   SURGEON:  Stefani Dama, MD   ANESTHESIA:  General endotracheal.   INDICATIONS:  [name] is a 77 year old woman who had a minor fall  and sustained substantial mid thoracic and lumbar back pain.  She has  evidence of a T12 compression fracture on plain radiographs and has been  having increasing back pain despite passage of time of now about 4 weeks  since the initial fall.  She was advised regarding acrylic balloon  kyphoplasty.   PROCEDURE:  The patient was brought to the operating room supine on the  stretcher.  After smooth induction of general endotracheal anesthesia,  she was turned prone.  The back was prepped with alcohol and DuraPrep  and draped in a sterile fashion.  Then, AP and lateral fluoroscopic  guidance was used to localize the T12 vertebrae with the vertebrae being  isolated.  The upper outer quadrant of the left-sided pedicle was  visualized radiographically and the skin above this area was infiltrated  with 1 mL of lidocaine with epinephrine.  A #11 blade was used to create  a stab incision.  Then, a Jamshidi needle was used to penetrate the  outer upper edge of the pedicle of T12 and drive the cannula towards the  vertebral body transpedicularly at the T12 level.  Once the cannula was  in the vertebral body, a core biopsy of the T12 vertebral body  was  obtained.  Then through this aperture, a balloon was inserted and  inflated to a total of 100 mmHg pressure and rapidly the pressure  deteriorated.  The balloon filled with void in the ventral aspect of the  vertebral body.  Acrylic kyphoplasty cement was then mixed at this time,  and once the cement had reached appropriate hardness, a total of 6 mL of  cement was inserted into the cannula filling the void under fluoroscopic  guidance.  When adequate filling of the void was obtained, no further  cement was inserted.  The trocar was removed.  No tails were noted and  then the singular stitch was placed in the subcuticular tissue in the  patient's back.  Final radiographs confirmed good filling of the bone  void at the T12 vertebrae.  The patient tolerated the procedure well,  was returned to recovery room in stable condition.     Stefani Dama, M.D.  Electronically Signed    HJE/MEDQ  D:  09/29/2008  T:  09/30/2008  Job:  [id number]

## 2011-02-12 NOTE — Assessment & Plan Note (Signed)
[facility]                         GASTROENTEROLOGY OFFICE NOTE   NAME:[name], Leslie METZGER                     MRN:          [id number]  DATE:05/19/2007                            DOB:          June 02, 1933    PROCEDURE:  Small bowel capsule endoscopy.   INDICATION:  GI bleeding, occult blood loss anemia. She had severe iron  deficiency anemia.   HISTORY:  This patient had a myocardial infarction in December of 2007  Has been on aspirin and Plavix. She had a duodenal AVM found on EGD by  Dr. Christella Hartigan recently. Colonoscopy was unrevealing.   Please see the photographs and printed capsule report for full details.   FINDINGS:  This is a completed study with a good prep. There was 1  versus 2 AVMs in the duodenum. I think it is possible that the capsule  refluxed back and the same AVM was seen twice. This could be the same  AVM that was seen on EGD recently.   PLAN:  Per Dr. Christella Hartigan and Dr. [name]. It could be reasonable to  ablate this duodenal AVM depending upon the clinical scenario.     Iva Boop, MD,FACG  Electronically Signed    CEG/MedQ  DD: 05/28/2007  DT: 05/29/2007  Job #: [id number]   cc:   Gordy Savers, MD

## 2011-02-15 NOTE — Op Note (Signed)
Leslie [name], Leslie [name]                        ACCOUNT NO.:  [id number]   MEDICAL RECORD NO.:  [id number]                   PATIENT TYPE:  AMB   LOCATION:  ENDO                                 [facility]AC[facility]   PHYSICIAN:  [name], M.D. [facility]               DATE OF BIRTH:  Oct 16, 1932   DATE OF PROCEDURE:  06/22/2003  DATE OF DISCHARGE:                                 OPERATIVE REPORT   PROCEDURE:  Upper endoscopy.   HISTORY OF PRESENT ILLNESS:  This 77 year old white female has undergone  cervical diskectomy in August of this year and had some problems with  gastroesophageal reflux prior to the surgery and dysphagia since the  surgery.  Barium swallow has showed delayed passage of 13 mm barium pill  through the distal esophagus suggestive of a stricture.  She has been  somewhat improved acid-suppressing agents.  She is now undergoing upper  endoscopy and possible esophageal dilatation.   ENDOSCOPE:  Olympus single-channel video endoscope.   CONSCIOUS SEDATION:  Versed 5 mg IV, fentanyl 50 mcg IV.   FINDINGS:  Olympus single-channel video endoscope passed under direct vision  through the posterior pharynx into the esophagus.  The patient was monitored  by pulse oximetry.  Oxygen saturations were normal.  She was cooperative.  Gag reflex was present.  Vallecula, pyriform sinus, and glottis were  unremarkable.  Proximal esophagus was normal.  There was no evidence of  edema or obstruction or any retained food.  Mucosa through the esophagus  appeared normal including the gastroesophageal junction at 35 cm from the  incisors.  There was a small sliding hiatal hernia measuring 2-3 cm in  length which was easily reducible.   Stomach:  The stomach was insufflated with air and showed somewhat  erythematous appearing stomach, mostly in the gastric antrum with some bile  reflux and reticulation of the mucosa.  Biopsies were taken for CLO-test  throughout for H pylori.  Pyloric outlet  was normal.  Retroflexion of the  endoscope revealed normal fundus and cardia.   Duodenum:  Duodenal bulb and descending duodenum were unremarkable.   The endoscope was retracted.  Maloney dilator, 48-French, passed blindly  into the esophagus without resistance.  There was no blood on the dilator.   IMPRESSION:  1. Nonspecific gastritis in the gastric antrum, status post CLO-test.  2. Small reducible hiatal hernia.  3. Status post passage of 48-French Maloney dilator without evidence of     definite stricture.   PLAN:  The patient's symptoms have somewhat improved since her office visit  and she appears to be slowly improving as far as her dysphagia which might  have been related to dysmotility and gastroesophageal reflux.  Since  there is no definite stricture and the patient was dilated, we will instruct  her to continue on soft diet, chew slowly and elevate the head of the bed at  night.  She  will check with [place] within two or three months to make sure that  all the dysphagia has resolved.                                               [name], M.D. [facility]    DB/MEDQ  D:  06/22/2003  T:  06/22/2003  Job:  [id number]   cc:   Stefani Dama, M.D.  [address]  [place]  [place] [address]  Fax: [phone]

## 2011-02-15 NOTE — H&P (Signed)
NAMESAHILY, BIDDLE              ACCOUNT NO.:  [id number]   MEDICAL RECORD NO.:  [id number]          PATIENT TYPE:  INP   LOCATION:  2918                         FACILITY:  [facility]   PHYSICIAN:  Marrian Salvage. Freida Busman, MD     DATE OF BIRTH:  [birth year]   DATE OF ADMISSION:  05/25/2006  DATE OF DISCHARGE:                                HISTORY & PHYSICAL   PRIMARY CARE PHYSICIAN:  Gordy Savers, M.D.   CARDIOLOGIST:  None, but would like to see Dr. Charlies Constable.   CHIEF COMPLAINT:  Transfer for non-ST-elevation MI.   HISTORY OF PRESENT ILLNESS:  The patient is a 77 year old female with  cardiac risk factors of diabetes, hypertension and a strong family history  for CAD.  She has a history of borderline stress about 12 years ago which  lead the a cardiac catheterization that she was told was totally normal here  at [facility].   The patient has been doing well until this last week.  Her sister was in the  [facility] for end stage cardiomyopathy.  The patient was in her room  on 06-05-2006 when she died.  The patient had been up for almost 2 days  straight.  She was also very close to her sister and was quite upset about  her death.  About 30 minutes after her sister died, the patient had sudden  onset of chest pain and heaviness.  This was associated with some shortness  of breath.  The pain was 10/10 initially, and that lasted for about 5  minutes.  It then gradually became less severe.  A nurse at the hospice took  her blood pressure given these symptoms, and it was found to be 210/110 with  a heart rate of about 110.  She was then taken to the [facility] in [place].  There she was given some nitroglycerin and was soon  pain free.  She has had no chest pain since that time.  EKG was basically  normal, but a troponin came back positive.  She was treated with Ativan,  Lopressor, nitroglycerin, aspirin, heparin and Integrilin.  She was admitted  to the  hospitalist service there.  The family requested transfer to [facility], given that she lives out here.  However, a decision was made to wait  24 hours until she was thought to be more stable.  This was given that it  was the weekend and she would not be cathed until Monday.  She has had no  further chest pain and is transferred tonight to our care.  She said today  she felt much better than yesterday and has gradually been improving.  Currently, she has denies any symptomatology.  She does still feel sat about  her sister's death.   PAST MEDICAL HISTORY:  1. Diabetes diagnosed in October 2006.  2. Hypertension.  3. Nephrolithiasis requiring left-sided surgical removal of a stone in the      past.  4. Hysterectomy and BSO.  5. Appendectomy.  6. Cholecystectomy next cervical.  7. Cervical spine surgery  C3, C4, C5.  8. Left knee arthroplasty.  9. GERD with hiatal hernia.   ALLERGIES:  1. IVP DYE caused anaphylaxis.  2. PENICILLIN causes hives.  3. SULFA causes hives.   MEDICATIONS AT HOME:  1. Verapamil 240 mg daily.  2. Benazepril 20 mg daily.  3. Hydrochlorothiazide 25 mg daily.  4. Metformin XR 500 mg daily.  5. Trazodone 100 mg nightly.  6. Flexeril 10 mg daily.  7. Vitamin C, D and calcium.  8. Centrum multivitamin daily.   MEDICATIONS ON TRANSFER:  1. Vitamin C, 1000 mg daily.  2. Aspirin 81 mg daily.  3. Benazepril 10 mg daily.  4. Calcium plus D b.i.d.  5. Coreg 6.25 mg b.i.d.  6. Cyclobenzaprine 10 mg nightly.  7. Colace 1 tablet daily.  8. Sliding scale insulin a.c.  9. Prevacid 30 mg daily.  10.Metformin XR 500 mg nightly.  11.Multivitamin 1 tablet daily.  12.Nitroglycerin as needed.  13.Afrin 0.05% spray.  14.Zocor 20 mg nightly.  15.Integument drip which was turned off at transfer.  16.Heparin drip which was turned off at transfer.  17.Tylenol as needed.  18.Maalox as needed  19.Xopenex as needed.  20.Ambien as needed.   SOCIAL HISTORY:  The  patient lives in [place] with her  husband.  She works for the [employer] part-time.  She has no  children of her own, but has 2 stepchildren, as well as a foster child and 6  grandchildren.  One of her stepchildren lives in [place], the other in  [place].  The patient is friends with [name] here at the hospital.  She smoked for 30 years but quit about 21 years ago.  She rarely drinks wine  or beer, but has no alcohol abuse history and denies drug use.  She follows  a diabetic diet.   FAMILY HISTORY:  Father died of an MI at age 16.  Paternal uncle died at age  75 of an MI.  Brother had an MI at age 33.  Her mother died of heart disease  at age 42.   REVIEW OF SYSTEMS:  Positive for chest pain and anxiety.  Otherwise, 14  system review is negative in detail.   CODE STATUS:  Full code.   PHYSICAL EXAMINATION:  VITAL SIGNS:  Temperature 98.2, pulse 75, blood  pressure 132/55, respirations 18, saturation 95% on room air.  Weight is 184  pounds, and she is 5 feet 4 inches.  GENERAL:  She was in no distress.  No rash.  No jaundice.  NECK:  JVP flat.  No carotid bruits.  Thyroid without enlargement.  She did  have a scar from prior cervical surgery.  LUNGS:  Clear to auscultation bilaterally.  HEART:  Regular rate and rhythm with normal S1-S2, no S4 and no murmur.  ABDOMEN:  Soft, nontender.  She did have an old scar on the abdomen from  kidney surgery.  Her right groin had a 2+ pulse with no bruit.  She had no  peripheral edema.  SKIN:  Warm and well-perfused.  NEUROLOGIC:  Intact.   CHEST X-RAY:  No chest x-ray available.   ELECTROCARDIOGRAM:  EKG on May 23, 2006 showed a rate of 98 in sinus  rhythm with PVCs, normal axis, normal intervals, an RSR primed in V1, no Q-  waves, T-wave flattening in V1 and laterally, which is nonspecific, and no  evidence of hypertrophy.  No comparison.  LABORATORY:  White blood cell count 8,  hematocrit 33,  platelets 305.  Sodium  140, potassium 3.9, BUN 17, creatinine 1.0, glucose 92.  LFTs normal.  Coags  normal.  MB was initially 9.7 and then went to 8.4.  Troponin-I was 1.51,  went to 1.6 and then with the 0.9 with an upper limit of normal of 0.5.  Total cholesterol 187, triglyceride 158, LDL 119 and HDL 39.   IMPRESSION:  A 77 year old female with no prior coronary disease who  presents with an acute coronary syndrome.   PROBLEM:  1. Non-ST-elevation myocardial infarction.  Most likely, given the      patient's family history, this represents acute plaque rupture in the      setting of stress and hypertension with resultant non-ST-elevation MI.      However, under the patient's severe emotional stress, I would entertain      the possibility of stress.  We will keep the patient on telemetry in      the step-down unit.  She will get aspirin, heparin and 2b3a continued.      Will also continue her beta blocker, ACE inhibitor and statin.  She      should get catheterization tomorrow with LV gram.  Based on those      results, further management will be decided.  2. CONTRAST DYE ALLERGY.  Will start steroids tonight, given her severe      reaction in the past.  She will also get and H1 blocker and H2 blocker      and in the morning, both high-dose.  3. Diabetes.  Hold metformin.  Close observation of her sugars on      steroids.  Accu-Cheks and ADA diet, as well as sliding scale.  Will      check an A1c.  4. Hypertension.  Blood pressure is currently well-controlled.  Will      continue her on an ACE inhibitor and beta blocker for now with the      addition of diuretic if needed.  5. Dyslipidemia.  Moderate to high-dose statin therapy.  6. Prophylaxis.  Continue her proton pump inhibitor.  Out of bed when she      os deemed stable.   DISPOSITION:  She is now 2 days status post non-ST-elevation myocardial  infarction.  She needs cath tomorrow, but could probably go home early to  mid  next week.           ______________________________  Marrian Salvage. Freida Busman, MD     LAA/MEDQ  D:  05/26/2006  T:  05/26/2006  Job:  [id number]   cc:   Gordy Savers, MD

## 2011-02-15 NOTE — Assessment & Plan Note (Signed)
[facility]                              CARDIOLOGY OFFICE NOTE   Leslie [name], Leslie [name]                     MRN:          [id number]  DATE:06/18/2006                            DOB:          02-12-1933    PRIMARY CARE PHYSICIAN:  Gordy Savers, MD   HISTORY OF PRESENT ILLNESS:  Leslie [name] is a very pleasant 77 year old  female patient, new to Dr. Juanda Chance, who was recently admitted to [facility] with non-ST elevation myocardial infarction.  She had a sister who  recently died prior to the event.  This occurred in [place], and she  was transferred to [facility] at her request.  At catheterization, she  only had moderate heavy calcifications of the distal left main and proximal  LAD without significant focal narrowing.  Her left ventriculogram revealed  an EF of 45-50% with some hypokinesis of the mid distal anterolateral apical  segment.  It was felt that she most likely had a Tako-Tsubo cardiomyopathy  with partial recovery of her LV function.  She was maintained on ACE  inhibitor and placed on Carvedilol.  She was also placed on Simvastatin for  treatment of hyperlipidemia.  She was also maintained on Plavix given her  recent non-ST elevation myocardial infarction.   The patient returns to the office today for follow up.  She is doing well.  She did have some indigestion last week and saw her primary care physician  who placed her back on Prevacid.  That has since resolved.  She has been a  little weak and slow to get her activity back up.  She did try to vacuum  last week and got somewhat short of breath with this.  Otherwise, she denies  any chest pain.  She denies any syncope or presyncope.  Denies any  palpitations.  Denies any pedal edema.   CURRENT MEDICATIONS:  1. Verapamil 240 mg daily.  2. Benazepril 20 mg daily.  3. HCTZ 25 mg daily.  4. Plavix 75 mg daily.  5. Coreg 6.25 mg b.i.d.  6. Aspirin 81 mg daily.  7. Metformin 500 mg daily.  8. Trazodone 100 mg daily.  9. Cyclobenzaprine 10 mg daily.  10.Simvastatin 40 mg q.h.s.  11.Vitamin C.  12.Centrum Silver.  13.Calcium 1 g daily.   ALLERGIES:  PENICILLIN, SULFA, IVP DYE.   PHYSICAL EXAMINATION:  GENERAL:  Well-developed, well-nourished female.  VITAL SIGNS:  Blood pressure 129/59, pulse 81, weight 185 pounds.  HEENT:  Unremarkable.  NECK:  Without jugular venous distention.  CARDIAC:  Normal S1, S2.  Regular rate and rhythm.  LUNGS:  Clear to auscultation bilateral without wheezes, rales or rhonchi.  ABDOMEN:  Soft, nontender.  EXTREMITIES:  Without edema.  Right groin without hematomas or bruits.   STUDIES:  Electrocardiogram revealed sinus rhythm with a heart rate of 71,  normal axis.  T wave inversions in 1, AVL, V3-6.  Rare PVC.   IMPRESSION:  1. Status post non-ST elevation myocardial infarction with minimal      nonobstructive coronary disease at catheterization.  2. 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  3. Diabetes mellitus type 2.  4. Hypertension.  5. Hyperlipidemia.  Simvastatin initiated during recent hospitalization.  6. IV dye allergy.  7. Gastroesophageal reflux disease/hiatal hernia.   PLAN:  The patient was also interviewed and examined by Dr. Juanda Chance.  She is  doing well from a post-hospitalization standpoint.  Her activity has been  slow to increase and she is somewhat weakened by her recent events.  She is  interested in participating in [employer] next week.  She is planning on  doing activities that are not that strenuous, and we think it is okay for  her to go ahead and proceed with that.  We will set her up with an exercise  treadmill test to evaluate her exercise tolerance.  We will also set her up  with an echocardiogram to reevaluate her LV function to see if this is  indeed recovered.  She will stay on all the medications as listed above.  She can follow up with Dr. Amador Cunas for  follow up on her lipids.  Dr.  Juanda Chance will see her back in several months.                                  Tereso Newcomer, PA-C                            Bruce R. Juanda Chance, MD, [facility]   SW/MedQ  DD:  06/18/2006  DT:  06/19/2006  Job #:  [id number]   cc:   Gordy Savers, MD

## 2011-02-15 NOTE — Assessment & Plan Note (Signed)
[facility]                                   ON-CALL NOTE   Leslie, [name]                       MRN:          [id number]  DATE:05/23/2006                            DOB:          April 06, 1933    TIME OF CALL:  6:30 p.m.   The caller is Dr. Yetta Barre at [facility].  Area code [phone].   BODY OF MESSAGE:  I spoke on the phone with Dr. Yetta Barre.  Leslie [name] has  come to the emergency room and the clinical suspicion is that the patient  has a fresh non-Q wave myocardial infarction.  The patient has requested  transfer to [facility].  Dr. Yetta Barre says that at that hospital, they  have diagnostic coronary catheterization available, but they do not do  interventional cardiology there.  Their secondary referral center is in  [place] which is in between [place] and [place].  I discussed with Dr.  Yetta Barre that a transfer of this distance in the setting of a suspected fresh  myocardial infarction cannot be medically justified.  I believe her best  options are admission at the hospital in [place] or transfer to [place],  much geographically closer, where interventional cardiology is available.  Dr. Yetta Barre agreed and will consider those options.                                   Sean A. Everardo All, MD   SAE/MedQ  DD:  05/23/2006  DT:  05/24/2006  Job #:  [id number]   cc:   Gordy Savers, MD

## 2011-02-15 NOTE — Assessment & Plan Note (Signed)
[facility]                            CARDIOLOGY OFFICE NOTE   Leslie [name], Leslie [name]                     MRN:          [id number]  DATE:09/05/2006                            DOB:          [birth year]    PRIMARY CARE PHYSICIAN:  Gordy Savers, M.D.   CLINICAL HISTORY:  [name] is 77 years old and was recently admitted  to [facility] with a non-ST-elevation infarction and underwent  catheterization and was found to have mostly nonobstructive coronary  disease and wall motion abnormality, consistent with Tako-tsubo  cardiomyopathy.  Her ejection fraction was 45-50%.   She has done quite well since that time, has had no recurrent chest  pain, shortness of breath, or palpitations.  She had been involved in  rehab.   Since she is very active in sports, we arranged for her to have a  followup echocardiogram, which showed minimal hypokinesis of the lateral  wall with an ejection fraction of 55% and she had a stress test, where  she had no evidence of ischemia and moderate exercise tolerance.   PAST MEDICAL HISTORY:  Significant for hypertension and diabetes and  hyperlipidemia.   CURRENT MEDICATIONS INCLUDE:  Verapamil, benazepril,  hydrochlorothiazide, Plavix, Coreg, aspirin, metformin, trazodone,  cyclobenzaprine, simvastatin, vitamins and calcium.   PHYSICAL EXAMINATION:  The blood pressure was 96/62 and the pulse 71 and  regular.  There no jugular venous distention.  The carotid pulses were  full, without bruits.  Chest was clear.  Heart rhythm was regular.  There were no murmurs or gallops.  Abdomen was soft, without  organomegaly.  Peripheral pulses were full.  There was no pedal edema.   IMPRESSION:  1. Recent non-ST-elevation myocardial infarction with nonobstructive      disease at catheterization, felt to be related to Tako-tsubo      cardiomyopathy.  2. Ejection fraction initially 45-50% at catheterization, now 55% by      echo.  3. Type 2 diabetes.  4. Hypertension.  5. Hyperlipidemia.  6. IV DYE ALLERGY.  7. GERD.   RECOMMENDATIONS:  I think [name] is doing quite well.  Her blood  pressure is on the low side and I think we can stop the verapamil and  hydrochlorothiazide.  She checks her blood  pressure regularly with a cuff and she can monitor this.  We will leave  her other medications the same and I will see her back in cardiac  followup in a year.     Bruce Elvera Lennox Juanda Chance, MD, [facility]  Electronically Signed    BRB/MedQ  DD: 09/05/2006  DT: 09/05/2006  Job #: [id number]

## 2011-02-15 NOTE — Op Note (Signed)
Leslie [name], Leslie [name]                        ACCOUNT NO.:  [id number]   MEDICAL RECORD NO.:  [id number]                   PATIENT TYPE:  INP   LOCATION:  3172                                 FACILITY:  [facility]   PHYSICIAN:  Stefani Dama, M.D.               DATE OF BIRTH:  05/15/1933   DATE OF PROCEDURE:  05/24/2003  DATE OF DISCHARGE:                                 OPERATIVE REPORT   PREOPERATIVE DIAGNOSIS:  C4-5, C5-6 cervical spondylosis with right cervical  radiculopathy.   POSTOPERATIVE DIAGNOSIS:  C4-5, C5-6 cervical spondylosis with right  cervical radiculopathy.   PROCEDURE:  1. Anterior cervical diskectomy.  2. Decompression of C4-5 and C5-6.  3. Arthrodesis with structural allograft and Synthes fixation C4-5, C5-6.   SURGEON:  Stefani Dama, M.D.   FIRST ASSISTANT:  Payton Doughty, M.D.   ANESTHESIA:  General endotracheal anesthesia.   INDICATIONS FOR PROCEDURE:  The patient is a 77 year old individual who has  had significant neck, shoulder and arm pain. She has profound spondylitic  changes at C4-5 and disk herniation on the right side at C5-6. She has been  advised regarding surgical decompression and stabilization of the 2 levels  most severely involved, 4-5  and 5-6.   DESCRIPTION OF PROCEDURE:  The patient was brought to the operating room and  placed on the table in the supine position. After the smooth induction of  general endotracheal anesthesia she was placed in 5 pounds  of halter  traction. Her neck was shaved, prepped with Duraprep and draped in a sterile  fashion.   A transverse incision was made on the left side of the neck and was carried  down through the platysma. A plane between the sternocleidomastoid and the  strap muscles was dissected bluntly until the prevertebral space was  reached.   The first identifiable disk space was noted to be that of C4-5 on a  localizing radiograph. The longus coli muscle was stripped off of either  side  and then a self-retaining retractor was placed in the wound. The  patient then underwent  removal  of significant osteophytes over the ventral  aspect of the vertebral bodies at C4-5.   This allowed for entry into the disk  space, and a significant quantity of  severely desiccated and degenerated disk material was removed from within  the  disk space. The bony endplates were noted to be quite irregular and  there was a large posterior  osteophyte from the inferior  margin of the  body of C4. Lateral osteophytes were also encountered in the uncinate  processes and these were drilled away with  2.3-mm dissecting tool. Care was  taken to decompress the  foramen on either  side adequately, providing both  a bony decompression and decompression of the soft tissues until the takeoff  in the path of the exiting nerve roots were identified  and decompressed.  Epidural bleeding was controlled with the bipolar cautery and some small  pledgets of Gelfoam soaked in Thrombin which were later irrigated away.   A 4-mm barrel bit was used to female parallel the endplates and to smooth the  endplates to allow placement  of a 7-mm lordosed bone graft. This was done  without any problem and attention was turned to C5-6 where a similar  procedure was carried out. Dr. Channing Mutters helped during this  portion of the  procedure to secure the additional disk  space and  then do the arthrodesis  by providing retraction and stabilization of the plate as it went in with 6  locking 4 x 14 mm screws from C4 to C6. Final localizing radiograph  identified  good position  of the bone grafts, both of which were 7-mm  lordotic grafts and the plate itself.   The soft tissues were then checked for hemostasis meticulously with the  bipolar cautery, and then ultimately the platysma was closed with 3-0 Vicryl  in an interrupted fashion. Then 3-0 Vicryl was used to close the  subcuticular tissue. Dermabond was placed on the skin.   The  patient tolerated the procedure well. She was returned to the recovery  room in stable condition.                                                Stefani Dama, M.D.    Merla Riches  D:  05/24/2003  T:  05/24/2003  Job:  [id number]

## 2011-02-15 NOTE — Procedures (Signed)
[facility]                                EXERCISE TREADMILL   NAME:[name], [name]                     MRN:          [id number]  DATE:07/04/2006                            DOB:          October 06, 1932    PRIMARY CARDIOLOGIST:  Everardo Beals. Juanda Chance, MD, [facility].   PATIENT PROFILE:  A 77 year old white female with history of tako-tsubo's  cardiomyopathy with catheterization in August of 2006, revealing  nonobstructive coronary disease who presents today for an exercise treadmill  test.  She also underwent echocardiogram earlier today.   TEST DATA:  Baseline heart rate was 80 b.p.m., baseline blood pressure  128/70.  The patient exercised using a Bruce protocol for a total of 4  minutes and 9 seconds to a maximum heart rate of 121 b.p.m., which was 81%  of the maximum predicted heart rate.  She achieved 5.90 METs.  Test was  stopped secondary to right hip pain and dyspnea.  She denied any chest pain.  Throughout the test remained in sinus rhythm with occasional PVCs and at  least 1 couplet.  Her baseline EKG does show minimal, less than 1 mm, J-  point depression and ST flattening in leads 2, 3, AVF and V4-V6.  She had no  acute ST T changes during exercise or recovery.      ______________________________  Nicolasa Ducking, ANP    ______________________________  Noralyn Pick. Eden Emms, MD, [facility]     CB/MedQ  DD:  07/04/2006  DT:  07/06/2006  Job #:  [id number]

## 2011-02-15 NOTE — Discharge Summary (Signed)
   [name], Leslie [name]                        ACCOUNT NO.:  [id number]   MEDICAL RECORD NO.:  [id number]                   PATIENT TYPE:  INP   LOCATION:  3011                                 FACILITY:  [facility]   PHYSICIAN:  Stefani Dama, M.D.               DATE OF BIRTH:  01-27-1933   DATE OF ADMISSION:  05/24/2003  DATE OF DISCHARGE:  05/27/2003                                 DISCHARGE SUMMARY   ADMITTING DIAGNOSIS:  Cervical spondylosis with radiculopathy C4-5 and C5-6.   DISCHARGE DIAGNOSES:  1. Cervical spondylosis with radiculopathy C4-5 and C5-6.  2. Hoarseness.  3. Dysphagia.   PROCEDURES:  1. Anterior cervical diskectomy.  2. Arthrodesis with structural Allograft.  3. Fixation C4-5, C5-6.   CONSULTATIONS:  1. Lucky Cowboy, M.D., Otolaryngology  2. Wilhemina Bonito. Marina Goodell, M.D. [facility], Gastroenterology   CONDITION ON DISCHARGE:  Improving.   HOSPITAL COURSE:  The patient is a 77 year old individual who underwent two  level anterior diskectomy and arthrodesis with structural allograft for  complaints of cervical radiculopathy.  Postoperatively, the patient was  noted to have significant difficulty with hoarseness,  I was concerned that  she may have had a recurrent laryngeal nerve injury and because this had  persisted for the first 24 hours and seemed to be getting somewhat worse,  ENT was asked to see the patient.   She was evaluated by Dr. Lucky Cowboy who noted that she had bilateral vocal  cord mobility; however, she had significant edema felt to be secondary to  the trauma of intubation.  The patient subsequently continued to complain of  significant difficulty with dysphagia and because this was not getting  better after the second hospital day, she was seen by Dr. Marina Goodell from [facility]  Gastroenterology for evaluation.  It was felt that she had some acute spasm  secondary to the trauma of surgery but this would be relieved with  medications including antireflux medications  for her gastroesophageal reflux  disease.   On the fourth hospital day, the patient finally seemed to be getting better  and was able to swallow some foods and liquids and was ambulatory.  Her  motor strength had been intact and seemed to be getting better.  She was  discharged home with prescription for some mild pain medication in the form  of hydrocodone.  She will be seen in the office in three weeks' time for  further followup.                                                Stefani Dama, M.D.    Leslie [name]  D:  06/09/2003  T:  06/10/2003  Job:  [id number]

## 2011-02-15 NOTE — Cardiovascular Report (Signed)
NAMEJENISE, Leslie [name]              ACCOUNT NO.:  [id number]   MEDICAL RECORD NO.:  [id number]          PATIENT TYPE:  INP   LOCATION:  2918                         FACILITY:  [facility]   PHYSICIAN:  Arturo Morton. Riley Kill, MD, FACCDATE OF BIRTH:  Nov 30, 1932   DATE OF PROCEDURE:  05/26/2006  DATE OF DISCHARGE:                              CARDIAC CATHETERIZATION   INDICATIONS:  Leslie [name] presented with a non-ST-elevation MI.  She has been  under considerable stress, having not slept in several nights with the  subsequent death of her sister.  She now presents with a non-ST-elevation MI  and marked anterolateral T-wave changes.  Because of this, she was brought  to the catheterization laboratory for further evaluation.   PROCEDURE:  1. Left heart catheterization.  2. Selective coronary arteriography.  3. Selective left ventriculography.   DESCRIPTION OF PROCEDURE:  The patient was brought to the catheterization  laboratory and prepped and draped in the usual fashion.  She was on  unfractionated heparin and an eptifibatide drip.  The heparin was stopped.  ACT was checked. Through an anterior puncture, the right femoral artery was  easily entered.  A 4-French sheath was then placed.  Views of the left and  right coronary arteries were then obtained using 4-French diagnostic  catheters.  Central aortic and left ventricular pressures were measured with  a pigtail.  Ventriculography was performed in the RAO projection.  I then  discussed her case with Dr. Charlies Constable.  She was subsequently taken to the  holding area in satisfactory clinical condition.   HEMODYNAMIC DATA.:  1. Central aortic pressure 133/68.  2. Left ventricular pressure 125/19.  3. No gradient on pullback across the aortic valve.   ANGIOGRAPHIC DATA.:  1. Ventriculography was done in the RAO projection.  Overall systolic      function appears to be reasonably preserved.  Ejection fraction would      be estimated at 45-50%  with some hypokinesis of the mid distal      anterolateral apical segment.  2. On plain fluoroscopy, there is fairly extensive calcification of the      distal left main and proximal LAD.  3. The left main is free of critical disease.  4. The left anterior descending artery courses to the apex.  There are      three modest size diagonal branches.  The first is relatively small.      The second has an upward takeoff.  I cannot exclude a lesion at this      location, but it does not appear to be critical and in the LAO cranial      views does not appear to be significant. The third LAD diagonal is      moderate size and without critical narrowing.  The LAD proper crosses      the apex and is without critical narrowing.  5. The circumflex provides three marginal branches.  Other than minimal      luminal irregularity, the circumflex system is free of critical      disease.  6. The right coronary is  a fairly large-caliber vessel providing a      posterior descending branch that bifurcates in the posterolateral      branch.  Critical stenosis is not noted.   CONCLUSIONS:  1. Mild reduction in left ventricular function with an apical wall motion      abnormality.  2. Moderate heavily calcification of the distal left main and proximal      left anterior descending without significant focal narrowing.  3. No critical focal obstruction.   DISPOSITION:  This could be a Takosubo presentation with partial recovery of  LV function.  She has marked EKG changes.  I have discussed the case with  Dr. Juanda Chance. At the present time, we will continue medical therapy.      Arturo Morton. Riley Kill, MD, [facility]  Electronically Signed     TDS/MEDQ  D:  05/26/2006  T:  05/26/2006  Job:  [id number]   cc:   Gordy Savers, MD  Everardo Beals. Juanda Chance, MD, [facility]  Hedwig Morton. Juanda Chance, MD  CV Laboratory

## 2011-02-15 NOTE — Discharge Summary (Signed)
NAMEEMMALIE, HAIGH              ACCOUNT NO.:  [id number]   MEDICAL RECORD NO.:  [id number]          PATIENT TYPE:  INP   LOCATION:  2001                         FACILITY:  [facility]   PHYSICIAN:  Everardo Beals. Juanda Chance, MD,FACCDATE OF BIRTH:  December 03, 1932   DATE OF ADMISSION:  05/25/2006  DATE OF DISCHARGE:  05/27/2006                                 DISCHARGE SUMMARY   PRIMARY CARDIOLOGIST:  Everardo Beals. Juanda Chance, M.D., [facility]   PRIMARY CARE PHYSICIAN:  Gordy Savers, M.D.   PRINCIPAL DIAGNOSIS:  Non-ST-elevation myocardial infarction.   SECONDARY DIAGNOSES:  1. 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  2. Nonobstructive coronary artery disease.  3. Type 2 diabetes mellitus.  4. Hypertension.  5. Hyperlipidemia.  6. IV contrast allergy.  7. History of nephrolithiasis.  8. Gastroesophageal reflux disease.  9. Hiatal hernia.  10.Status post total abdominal hysterectomy/bilateral salpingo-      oophorectomy.  11.Status post appendectomy.  12.History of left knee arthroplasty.  13.Remote tobacco abuse.   ALLERGIES:  1. IVP CONTRAST causes anaphylaxis.  2. PENICILLIN causes hives.  3. SULFA causes hives.   PROCEDURES:  Left heart cardiac catheterization.   HISTORY OF PRESENT ILLNESS:  A 77 year old white female with no prior  history of coronary artery disease who was in her usual state of health  until approximately May 23, 2006 when approximately 30 minutes after the  death of her sister who had been cared for at the [facility], Ms.  Moragne developed sudden onset of chest pain and heaviness associated with  shortness of breath.  She was noted to be hypertensive by the nurse at the  hospice with a blood pressure of 210/110, and she was transported to [facility] where she was noted to have a troponin of 1.5 with a CK-MB  of 9.7.  She was subsequently admitted to [facility] and placed  on anticoagulation with heparin and Integrilin.  At her family's  request,  she was transferred to [facility] on May 25, 2006 for further evaluation.   HOSPITAL COURSE:  Ms. Storck underwent left heart cardiac catheterization on  May 26, 2006 revealing a normal left main, 20-30% stenosis in the  proximal LAD, and a less than 20% stenosis in the mid RCA.  Left  ventriculography revealed an EF of 45-50% with apical hypokinesis  potentially suggestive of partially recovered Tako-Tsubo cardiomyopathy.  She has been maintained on her beta blocker and ACE inhibitor, as well as  statin therapy, and, because of her abnormal troponins, has been initiated  on Plavix therapy.  She has not had any recurrent chest discomfort or  difficulty breathing.  She will follow up with Dr. Juanda Chance in approximately 2  weeks and at some point undergo repeat echocardiogram to reevaluate LV  function.  She is being discharged home today in satisfactory condition.   DISCHARGE LABORATORIES:  Hemoglobin 11.0, hematocrit 32.1, WBC 12.6,  platelets 262, MCV 91.2.  Sodium 138, potassium 3.9, chloride 107, CO2 of  26, BUN 19, creatinine 0.9, glucose 127.  PT 13.0, INR 1.0, CK 57, MB 2.9,  troponin I of 0.14, calcium  9.2.  Admission BNP 201.0.  Hemoglobin A1c of  6.7.   DISPOSITION:  Ms. Schmiesing is being discharged home today in good condition.   FOLLOW-UP PLAN AND APPOINTMENTS:  1. She is asked to followup with Amador Cunas in approximately 3-4 weeks.  2. She is to followup with Dr. Juanda Chance on June 18, 2006 at 9:30 a.m..   DISCHARGE MEDICATIONS:  1. Aspirin 81 mg daily.  2. Plavix 75 mg daily.  3. Coreg CR 20 mg daily.  4. Zocor 40 mg q.h.s.  5. Benazepril 10 mg daily.  6. Colace 100 mg daily.  7. Prevacid 30 mg daily.  8. Metformin 5 mg daily to be resumed May 28, 2006.  9. HCTZ 25 mg daily.  10.Verapamil 240 mg daily.  11.Flexeril 10 mg q.h.s. p.r.n.   OUTSTANDING LABORATORY STUDIES:  None.   DURATION OF DISCHARGE ENCOUNTER:  Forty minutes including physician  time.     ______________________________  Nicolasa Ducking, ANP    ______________________________  Everardo Beals. Juanda Chance, MD,FACC    CB/MEDQ  D:  05/27/2006  T:  05/27/2006  Job:  [id number]   cc:   Gordy Savers, MD

## 2011-02-22 ENCOUNTER — Other Ambulatory Visit: Payer: Self-pay | Admitting: Internal Medicine

## 2011-03-18 ENCOUNTER — Telehealth: Payer: Self-pay | Admitting: *Deleted

## 2011-03-18 MED ORDER — HYDROCODONE-HOMATROPINE 5-1.5 MG/5ML PO SYRP: 5.0000 mL | ORAL_SOLUTION | Freq: Four times a day (QID) | ORAL | Status: AC | PRN

## 2011-03-18 NOTE — Telephone Encounter (Signed)
Generic  Hydromet 6 ounces 1 teaspoon every 6 hours as needed for cough  Recommend Tylenol or Advil for sore throat and fever

## 2011-03-18 NOTE — Telephone Encounter (Signed)
Pt is complaining of a sore throat, Uri and cough .  No fever and wants RX called to [facility].

## 2011-03-18 NOTE — Telephone Encounter (Signed)
[facility] Pt aware. KIK

## 2011-06-05 ENCOUNTER — Telehealth: Payer: Self-pay | Admitting: Cardiology

## 2011-06-05 DIAGNOSIS — I10 Essential (primary) hypertension: Secondary | ICD-10-CM

## 2011-06-05 DIAGNOSIS — I429 Cardiomyopathy, unspecified: Secondary | ICD-10-CM

## 2011-06-05 NOTE — Telephone Encounter (Signed)
I talked with pt. Pt states for about the last 2 weeks her BP had been high. Pt states she started feeling funny about 2 weeks ago. She checked her blood sugar and it was OK. Pt states she checked her BP and  It was 162/77. Today her BP was 189/122 after meds. Yesterday BP was 160/92 and 173/77. Pt states about 1 week ago she noticed an increase in her SOB with exertion. Pt also states a shooting pain up the back of her neck radiating across her back.

## 2011-06-05 NOTE — Telephone Encounter (Signed)
I reviewed with Dr Shirlee Latch. He recommended pt increase Benicar to 40mg  daily / get BMP and see Tereso Newcomer, PA-c in 1 week. Pt agreed with this plan. She will continue to take and record her BP about 1 1/2 -2 hours after she takes her medication and bring these readings to her appt with Brynda Rim 06/11/11.

## 2011-06-05 NOTE — Telephone Encounter (Signed)
Pt calling c/o high BP, since August 22. This morning pt took BP in left arm 193/87 in right arm 187/106. Pt said she is not having chest pains. Pt is having pain in left arm. Pt said she doesn't mind coming in to get her BP checked here. Pt said her BP machine is new so she doesn't think it is malfunctioning.   The lowest is 158/91 the rest of the times her BP runs high. Please return pt call to discuss further.

## 2011-06-10 ENCOUNTER — Encounter: Payer: Self-pay | Admitting: Physician Assistant

## 2011-06-11 ENCOUNTER — Other Ambulatory Visit (INDEPENDENT_AMBULATORY_CARE_PROVIDER_SITE_OTHER): Payer: Medicare Other | Admitting: *Deleted

## 2011-06-11 ENCOUNTER — Encounter: Payer: Self-pay | Admitting: Physician Assistant

## 2011-06-11 ENCOUNTER — Ambulatory Visit (INDEPENDENT_AMBULATORY_CARE_PROVIDER_SITE_OTHER): Payer: Medicare Other | Admitting: Physician Assistant

## 2011-06-11 VITALS — BP 133/63 | HR 69 | Ht 64.0 in | Wt 173.8 lb

## 2011-06-11 DIAGNOSIS — R0602 Shortness of breath: Secondary | ICD-10-CM

## 2011-06-11 DIAGNOSIS — I10 Essential (primary) hypertension: Secondary | ICD-10-CM

## 2011-06-11 DIAGNOSIS — M79609 Pain in unspecified limb: Secondary | ICD-10-CM

## 2011-06-11 DIAGNOSIS — R0609 Other forms of dyspnea: Secondary | ICD-10-CM

## 2011-06-11 DIAGNOSIS — I429 Cardiomyopathy, unspecified: Secondary | ICD-10-CM

## 2011-06-11 DIAGNOSIS — R0989 Other specified symptoms and signs involving the circulatory and respiratory systems: Secondary | ICD-10-CM

## 2011-06-11 DIAGNOSIS — M79602 Pain in left arm: Secondary | ICD-10-CM

## 2011-06-11 DIAGNOSIS — R0600 Dyspnea, unspecified: Secondary | ICD-10-CM | POA: Insufficient documentation

## 2011-06-11 NOTE — Progress Notes (Signed)
History of Present Illness: Primary Cardiologist:  Dr. Marca Ancona Primary Provider: Eleonore Chiquito MD   Leslie [name] is a 77 y.o. female presents for follow up on BP.  She has a h/o diabetes, HTN, and hyperlipidemia as well as history of presumed Takotsubo cardiomyopathy.   She was last seen in 2/12 by Dr. Shirlee Latch.  EF was normalized at that time.  Her medications were continued and she was to followup in one year.  She called in recently with blood pressure fluctuations.  Her pressure was as high as 189/122.  Her Benicar was increased to 40 mg a day and she was asked to follow up with me today.  She has been under some increased stress recently.  Her husband passed away several months ago.  She is trying to decide whether or not she should move to [facility] where her family is or stay here.  She has noted increased blood pressures as noted.  She feels tired.  Her left arm feels weird at times.  It feels heavy at times.  She does note exertional shortness of breath that seems to be worse.  This is worse over the last 3 months.  She describes class IIb symptoms.  She denies chest discomfort.  She does have pain across her shoulder blades.  She attributes this to her cervical disc disease.  She had surgery several years ago.  She is not exercising as much as she used to prior to her husband's illness.  She denies syncope.  She denies orthopnea, PND or edema.  Labs (10/11): K 4.7, creatinine 0.8, LDL 86, HDL 49, TSH normal   Past Medical History:  1. Takotsubo cardiomyopathy: Sister died in April 25, 2023 and patient developed CP with ECG changes and evidence for NSTEMI. LHC showed mild nonobstructive CAD with mildly decreased LV systolic function.  Echo in 2/11 showed EF 40-45% with diffuse hypokinesis.  Echo (2/12) showed EF 50-55% with no significant valvular abnormalities and normal RV.  2. LHC 04/25/23): Luminal irregularities.  3. HIATAL HERNIA (ICD-553.3) and GERD  4. DIVERTICULOSIS OF COLON  (ICD-562.10): History of GI bleeding.  5. ARTERIOVENOUS MALFORMATION (ICD-747.60)  6. NEPHROLITHIASIS, HX OF (ICD-V13.01)  7. ANEMIA NEC (ICD-285.8)  8. OSTEOARTHRITIS (ICD-715.90)  9. Hyperlipidemia  10. Diabetes.  11. HTN  12. ALLERGIC RHINITIS, SEASONAL (ICD-477.0)  13. DEGENERATIVE JOINT DISEASE, CERVICAL SPINE (ICD-721.90)  14. Depression    Current Outpatient Prescriptions  Medication Sig Dispense Refill  . aspirin 81 MG tablet Take 81 mg by mouth daily.        . calcium gluconate 500 MG tablet Take 500 mg by mouth daily.        . carvedilol (COREG) 12.5 MG tablet Take 12.5 mg by mouth 2 (two) times daily with a meal.        . Cholecalciferol (VITAMIN D) 400 UNITS capsule Take 400 Units by mouth daily.        . citalopram (CELEXA) 40 MG tablet TAKE 1 TABLET BY MOUTH EVERY DAY  90 tablet  3  . ferrous sulfate 325 (65 FE) MG EC tablet Take 325 mg by mouth daily with breakfast.        . glucosamine-chondroitin 500-400 MG tablet Take 1 tablet by mouth daily.        Marland Kitchen glucose blood (FREESTYLE TEST STRIPS) test strip 1 each by Other route daily. Use as instructed       . LORazepam (ATIVAN) 0.5 MG tablet Take 0.5 mg by mouth 2 (two) times  daily as needed.        . metFORMIN (GLUCOPHAGE-XR) 500 MG 24 hr tablet TAKE 2 TABLETS BY MOUTH AT BEDTIME  180 tablet  4  . olmesartan (BENICAR) 40 MG tablet Take 1 tablet (40 mg total) by mouth daily.      . Omega-3 Fatty Acids (FISH OIL) 1000 MG CAPS Take by mouth 2 (two) times daily.        Marland Kitchen omeprazole (PRILOSEC) 20 MG capsule TAKE 1 CAPSULE EVERY DAY  90 capsule  3  . simvastatin (ZOCOR) 40 MG tablet TAKE 1 TABLET BY MOUTH EVERY DAY  90 tablet  1  . traZODone (DESYREL) 100 MG tablet Take 100 mg by mouth at bedtime.        . vitamin E 400 UNIT capsule Take 400 Units by mouth daily.          Allergies: Allergies  Allergen Reactions  . Buprenorphine Hcl   . Penicillins   . Streptomycin   . Sulfonamide Derivatives     Social history:   Nonsmoker  Family history:  Positive for CAD; father died age 66, brother died age 29  ROS:  Please see the history of present illness.  She reports some headaches.  She's also had some blurred vision and has seen her optometrist with normal checkups.  She notes some recent diarrhea.  Otherwise, all other systems reviewed and negative.   Vital Signs: BP 133/63  Pulse 69  Ht 5\' 4"  (1.626 m)  Wt 173 lb 12.8 oz (78.835 kg)  BMI 29.83 kg/m2  Repeat blood pressure by me on the left 130/70; on the right 150/68  PHYSICAL EXAM: Well nourished, well developed, in no acute distress HEENT: normal Neck: no JVD Vascular: No carotid bruits noted; positive left subclavian bruit; radial pulses equal bilaterally Cardiac:  normal S1, S2; RRR; no murmur Lungs:  clear to auscultation bilaterally, no wheezing, rhonchi or rales Abd: soft, nontender, no hepatomegaly Ext: no edema Skin: warm and dry Neuro:  CNs 2-12 intact, no focal abnormalities noted Psych: Normal affect  EKG:  Sinus rhythm, heart rate 65, leftward axis, nonspecific ST-T wave changes, no significant change when compared to prior tracing  ASSESSMENT AND PLAN:

## 2011-06-11 NOTE — Assessment & Plan Note (Signed)
Blood pressure improved since increasing her Benicar several days ago.  Check a basic metabolic panel today.  Also check a CBC.  If her creatinine is abnormal, proceed with renal arterial Dopplers.

## 2011-06-11 NOTE — Patient Instructions (Signed)
Your physician recommends that you return for lab work in: TODAY BMET, CBC W/DIFF, BNP 786.05, 401.9  Your physician has requested that you have a carotid duplex DX SUBCLAVIAN BRUIT WITH UNEQUAL BLOOD PRESSURE AND LEFT ARM PAIN. This test is an ultrasound of the carotid arteries in your neck. It looks at blood flow through these arteries that supply the brain with blood. Allow one hour for this exam. There are no restrictions or special instructions.   Your physician has requested that you have en exercise stress myoview DX 786.05, SHORTNESS OF BREATH, LEFT ARM PAIN. For further information please visit [email or web address] Please follow instruction sheet, as given.  Your physician recommends that you schedule a follow-up appointment in: 07/04/11 @ 3:30 WITH DR. Shirlee Latch

## 2011-06-11 NOTE — Assessment & Plan Note (Signed)
She has a left subclavian bruit and some left arm discomfort.  She also has unequal blood pressures.  I will set her up for carotid Dopplers to assess for significant subclavian stenosis.  I have recommended that she check her blood pressure in the right arm for now.

## 2011-06-11 NOTE — Assessment & Plan Note (Signed)
Last echocardiogram demonstrated normal LV function.

## 2011-06-11 NOTE — Assessment & Plan Note (Signed)
Obtain a stress Myoview as noted.  She did not really appear to be volume overloaded and she had normal LV function when last checked.  I will also get a BNP with her blood work today.  As noted a CBC will be obtained.

## 2011-06-11 NOTE — Assessment & Plan Note (Signed)
Possibly due to subclavian stenosis.  However, she does have significant cardiac risk factors.  She had minimal coronary plaque 5 years ago at cardiac catheterization.  She has had increased shortness of breath.  I will set her up for a stress Myoview.  Followup in 2-3 weeks.

## 2011-06-12 LAB — CBC WITH DIFFERENTIAL/PLATELET
Basophils Relative: 0.9 % (ref 0.0–3.0)
Eosinophils Relative: 4 % (ref 0.0–5.0)
HCT: 38.9 % (ref 36.0–46.0)
Lymphs Abs: 1.3 10*3/uL (ref 0.7–4.0)
MCV: 94.6 fl (ref 78.0–100.0)
Monocytes Absolute: 0.4 10*3/uL (ref 0.1–1.0)
RBC: 4.12 Mil/uL (ref 3.87–5.11)
WBC: 5.3 10*3/uL (ref 4.5–10.5)

## 2011-06-12 LAB — BASIC METABOLIC PANEL
Chloride: 107 mEq/L (ref 96–112)
Potassium: 4.4 mEq/L (ref 3.5–5.1)
Sodium: 143 mEq/L (ref 135–145)

## 2011-06-13 ENCOUNTER — Other Ambulatory Visit: Payer: Self-pay | Admitting: Dermatology

## 2011-06-13 NOTE — Progress Notes (Signed)
Agree with note, will await studies.   Leslie [name] [employer]

## 2011-06-19 ENCOUNTER — Encounter: Payer: Self-pay | Admitting: *Deleted

## 2011-06-20 LAB — BASIC METABOLIC PANEL
BUN: 15
Chloride: 104
Creatinine, Ser: 0.97
GFR calc Af Amer: >60
GFR calc non Af Amer: 56 — ABNORMAL LOW
Potassium: 4.1

## 2011-07-01 ENCOUNTER — Encounter: Payer: Medicare Other | Admitting: *Deleted

## 2011-07-01 ENCOUNTER — Telehealth: Payer: Self-pay | Admitting: Cardiology

## 2011-07-01 ENCOUNTER — Encounter (HOSPITAL_COMMUNITY): Payer: Medicare Other | Admitting: Radiology

## 2011-07-01 NOTE — Telephone Encounter (Signed)
I talked with pt. Pt states myoview was rescheduled to 07/16/11. Pt asking if she should reschedule appt scheduled with Dr Shirlee Latch 07/04/11 until after the myoview is done 07/16/11. Pt states she does not feel she needs to see Dr Shirlee Latch before the [facility] and is keeping a record of her BP. She plans to bring these readings to the appt with Dr Shirlee Latch.

## 2011-07-01 NOTE — Telephone Encounter (Signed)
Pt wants to know if pt still needs to be seen by Dr. Shirlee Latch on 10/4. Pt carotid and stress myoview had to be rs and pt thought the appt w/ Shirlee Latch was to go over results of those test.   Please return pt call to inform if she has appt on 10/4 or not.

## 2011-07-04 ENCOUNTER — Ambulatory Visit: Payer: Medicare Other | Admitting: Cardiology

## 2011-07-04 LAB — BASIC METABOLIC PANEL
Calcium: 9.3 mg/dL (ref 8.4–10.5)
Chloride: 103 mEq/L (ref 96–112)
Creatinine, Ser: 0.74 mg/dL (ref 0.4–1.2)
GFR calc Af Amer: >60 mL/min (ref >60)
Sodium: 140 mEq/L (ref 135–145)

## 2011-07-04 LAB — CBC
Hemoglobin: 12.1 g/dL (ref 12.0–15.0)
RBC: 4.05 MIL/uL (ref 3.87–5.11)
WBC: 8.1 10*3/uL (ref 4.0–10.5)

## 2011-07-04 LAB — GLUCOSE, CAPILLARY
Glucose-Capillary: 102 mg/dL — ABNORMAL HIGH (ref 70–99)
Glucose-Capillary: 118 mg/dL — ABNORMAL HIGH (ref 70–99)

## 2011-07-15 LAB — CROSSMATCH
ABO/RH(D): O POS
Antibody Screen: POSITIVE
DAT, IgG: NEGATIVE
Donor AG Type: NEGATIVE
Donor AG Type: NEGATIVE
Donor AG Type: NEGATIVE

## 2011-07-15 LAB — HEMOGLOBIN AND HEMATOCRIT, BLOOD
HCT: 35.7 — ABNORMAL LOW
Hemoglobin: 11.7 — ABNORMAL LOW
Hemoglobin: 8.1 — ABNORMAL LOW

## 2011-07-15 LAB — PROTIME-INR
INR: 0.9
Prothrombin Time: 12.7

## 2011-07-15 LAB — CBC
Hemoglobin: 5.6 — CL
RBC: 2.88 — ABNORMAL LOW
WBC: 7.4

## 2011-07-15 LAB — FERRITIN: Ferritin: 2 — ABNORMAL LOW (ref 10–291)

## 2011-07-15 LAB — APTT: aPTT: 32

## 2011-07-15 LAB — HEMOGLOBIN A1C: Hgb A1c MFr Bld: 6.5 — ABNORMAL HIGH

## 2011-07-16 ENCOUNTER — Encounter (INDEPENDENT_AMBULATORY_CARE_PROVIDER_SITE_OTHER): Payer: Medicare Other | Admitting: *Deleted

## 2011-07-16 ENCOUNTER — Telehealth: Payer: Self-pay

## 2011-07-16 ENCOUNTER — Encounter: Payer: Self-pay | Admitting: Internal Medicine

## 2011-07-16 ENCOUNTER — Ambulatory Visit (INDEPENDENT_AMBULATORY_CARE_PROVIDER_SITE_OTHER): Payer: Medicare Other | Admitting: Internal Medicine

## 2011-07-16 ENCOUNTER — Ambulatory Visit (HOSPITAL_COMMUNITY): Payer: Medicare Other | Attending: Cardiology | Admitting: Radiology

## 2011-07-16 DIAGNOSIS — R0602 Shortness of breath: Secondary | ICD-10-CM | POA: Insufficient documentation

## 2011-07-16 DIAGNOSIS — M79609 Pain in unspecified limb: Secondary | ICD-10-CM | POA: Insufficient documentation

## 2011-07-16 DIAGNOSIS — Z0000 Encounter for general adult medical examination without abnormal findings: Secondary | ICD-10-CM

## 2011-07-16 DIAGNOSIS — R0609 Other forms of dyspnea: Secondary | ICD-10-CM

## 2011-07-16 DIAGNOSIS — M79602 Pain in left arm: Secondary | ICD-10-CM

## 2011-07-16 DIAGNOSIS — J069 Acute upper respiratory infection, unspecified: Secondary | ICD-10-CM

## 2011-07-16 DIAGNOSIS — I10 Essential (primary) hypertension: Secondary | ICD-10-CM

## 2011-07-16 DIAGNOSIS — Z23 Encounter for immunization: Secondary | ICD-10-CM

## 2011-07-16 DIAGNOSIS — Z87891 Personal history of nicotine dependence: Secondary | ICD-10-CM

## 2011-07-16 DIAGNOSIS — R062 Wheezing: Secondary | ICD-10-CM

## 2011-07-16 DIAGNOSIS — R0989 Other specified symptoms and signs involving the circulatory and respiratory systems: Secondary | ICD-10-CM

## 2011-07-16 DIAGNOSIS — E119 Type 2 diabetes mellitus without complications: Secondary | ICD-10-CM

## 2011-07-16 DIAGNOSIS — R0600 Dyspnea, unspecified: Secondary | ICD-10-CM

## 2011-07-16 DIAGNOSIS — I2510 Atherosclerotic heart disease of native coronary artery without angina pectoris: Secondary | ICD-10-CM

## 2011-07-16 MED ORDER — TECHNETIUM TC 99M TETROFOSMIN IV KIT
11.0000 | PACK | Freq: Once | INTRAVENOUS | Status: AC | PRN
  Administered 2011-07-16: 11 via INTRAVENOUS

## 2011-07-16 MED ORDER — ATROPINE SULFATE 0.1 MG/ML IJ SOLN
0.2500 mg | Freq: Once | INTRAMUSCULAR | Status: AC
  Administered 2011-07-16: 0.25 mg via INTRAVENOUS

## 2011-07-16 MED ORDER — METHYLPREDNISOLONE ACETATE 40 MG/ML IJ SUSP
40.0000 mg | Freq: Once | INTRAMUSCULAR | Status: AC
  Administered 2011-07-16: 40 mg via INTRAMUSCULAR

## 2011-07-16 MED ORDER — TECHNETIUM TC 99M TETROFOSMIN IV KIT
33.0000 | PACK | Freq: Once | INTRAVENOUS | Status: AC | PRN
  Administered 2011-07-16: 33 via INTRAVENOUS

## 2011-07-16 MED ORDER — SODIUM CHLORIDE 0.9 % IV SOLN
40.0000 ug/kg | INTRAVENOUS | Status: DC
  Administered 2011-07-16: 40 ug/kg/min via INTRAVENOUS

## 2011-07-16 MED ORDER — OLMESARTAN MEDOXOMIL-HCTZ 40-12.5 MG PO TABS: 1.0000 | ORAL_TABLET | Freq: Every day | ORAL | Status: DC

## 2011-07-16 MED ORDER — ALBUTEROL SULFATE (2.5 MG/3ML) 0.083% IN NEBU
5.0000 mg | INHALATION_SOLUTION | Freq: Once | RESPIRATORY_TRACT | Status: AC
  Administered 2011-07-16: 5 mg via RESPIRATORY_TRACT

## 2011-07-16 MED ORDER — ALBUTEROL SULFATE HFA 108 (90 BASE) MCG/ACT IN AERS
2.0000 | INHALATION_SPRAY | Freq: Once | RESPIRATORY_TRACT | Status: AC
  Administered 2011-07-16: 2 via RESPIRATORY_TRACT

## 2011-07-16 NOTE — Telephone Encounter (Signed)
Took live call from cindy at cardio - was there to do test - SOB , rales in lung - gave breathing tx - no improvement - needs to be seen - instructed to have pt come over now. KIK

## 2011-07-16 NOTE — Progress Notes (Signed)
[facility] SITE 3 NUCLEAR MED [address] [place] [address] [phone]  Cardiology Nuclear Med Study  Leslie [name] is a 77 y.o. female [id number] 05-12-1933   Nuclear Med Background Indication for Stress Test:  Evaluation for Ischemia History:  H/O NSTEMI; 8/07 MPS:(+)>Cath:Mild N/O CAD, EF=45-50%; 2/12 Echo:EF=50-55%; H/O Takotsubo Cardiac Risk Factors: Family History - CAD, History of Smoking, Hypertension, Lipids, NIDDM, Overweight and RBBB  Symptoms:  DOE and Fatigue   Nuclear Pre-Procedure Caffeine/Decaff Intake:  None NPO After: 7:00pm   Lungs:  Moderate expiratory wheezes, Albuterol inhaler used, which decreased the wheezes to mild, also cleared with cough.  Patient attempted to walk the treadmill utilizing the Bruce protocol and O2 SATS dropped to 80% after just 3-minutes.  Treadmill was stopped and she was given a Albuterol nebulizer treatment.  She was changed to Dobutamine and lungs prior to infusion had minimal expiratory wheezes with O2 SAT of 98%. IV 0.9% NS with Angio Cath:  22g  IV Site: R Wrist  IV Started by:  Cathlyn Parsons, RN  Chest Size (in):  36 Cup Size: B  Height: 5\' 4"  (1.626 m)  Weight:  178 lb (80.74 kg)  BMI:  Body mass index is 30.55 kg/(m^2). Tech Comments:  Coreg held x 24 hrs.Moderate Expiratory wheezing with SOB after walking treadmill. Nebulizer treatment with Albuterol 5 mg via 8L O2 per mask.Patsy Edwards,RN.      Nuclear Med Study 1 or 2 day study: 1 day  Stress Test Type:  Dobutamine  Reading MD: Arvilla Meres, MD  Order Authorizing Provider:  Fransico Meadow  Resting Radionuclide: Technetium 34m Tetrofosmin  Resting Radionuclide Dose: 10.8 mCi   Stress Radionuclide:  Technetium 54m Tetrofosmin  Stress Radionuclide Dose: 33.0 mCi           Stress Protocol Rest HR: 70 Stress HR: 134  Rest BP: 110/67 Stress BP: 216/85  Exercise Time (min): 13:30 METS: n/a   Predicted Max HR: 144 bpm % Max HR: 93.06  bpm Rate Pressure Product: 46962   Dose of Adenosine (mg):  n/a Dose of Lexiscan: n/a mg  Dose of Atropine (mg): n/a Dose of Dobutamine: 40 mcg/kg/min (at max HR)  Stress Test Technologist: Smiley Houseman, CMA-N  Nuclear Technologist:  Doyne Keel, CNMT     Rest Procedure:  Myocardial perfusion imaging was performed at rest 45 minutes following the intravenous administration of Technetium 83m Tetrofosmin.  Rest ECG: Nonspecific T-wave changes with occasional PVC's.  Stress Procedure:  Patient attempted to walk the treadmill utilizing the Bruce protocol, but was unable to complete due to a bronchospasm.  She was treated with Albuterol nebulizer and then given IV dobutamine with 0.25 mg IV atropine.  She denied any chest pain and the EKG was non diagnostic due the baseline.  There were frequent PVC's with couplets and several runs of v-tach ranging from 3-10 beats.  She had a hypertensive response during infusion, 216/85.  Technetium 12m Tetrofosmin was injected at peak heart rate and quantitative spect images were obtained after a 45 minute delay.  Stress ECG: Insignificant upsloping ST segment depression. Frequent PVCs.  QPS Raw Data Images:  Normal; no motion artifact; normal heart/lung ratio. Stress Images:  Normal homogeneous uptake in all areas of the myocardium. Rest Images:  Normal homogeneous uptake in all areas of the myocardium. Subtraction (SDS):  Normal Transient Ischemic Dilatation (Normal <1.22):  1.07 Lung/Heart Ratio (Normal <0.45):  0.28  Quantitative Gated Spect Images QGS EDV:  113 ml  QGS ESV:  56 ml QGS cine images:  NL LV Function; NL Wall Motion QGS EF: 51%  Impression Exercise Capacity:  Dobutamine study with no exercise. BP Response:  Hypertensive blood pressure response. Clinical Symptoms:  No chest pain. ECG Impression:  Insignificant upsloping ST segment depression. Comparison with Prior Nuclear Study: No images to compare  Overall Impression:  Normal  stress nuclear study.    Rayford Williamsen

## 2011-07-16 NOTE — Patient Instructions (Signed)
Dulera   0ne puff twice daily  Alburerol  -  2 puffs every 6 hours   Get plenty of rest, Drink lots of  clear liquids, and use Tylenol or ibuprofen for fever and discomfort.    Mucinex DM  One twice daily

## 2011-07-16 NOTE — Progress Notes (Signed)
  Subjective:    Patient ID: Leslie [name], female    DOB: 1933-06-25, 77 y.o.   MRN: [id number]  HPI 77 year old patient who has a prior history of tobacco use. She also is a history of allergic rhinitis. For the past week she has had some increasing wheezing shortness of breath cough and chest congestion. She was seen by cardiology earlier today for a stress test and carotid artery Doppler study. These studies were completed but do to wheezing she was referred here for further evaluation. She denies any productive cough. She received a nebulizer treatment at [facility] care and has improved nicely. She has treated hypertension. She states that blood pressures have running quite high especially the systolic. Her Benicar has been increased from 20-40 mg She has type 2 diabetes which has been quite stable. Hemoglobin A1c's have been near normal. She maintains very nice glycemic control    Review of Systems  Constitutional: Negative.   HENT: Negative for hearing loss, congestion, sore throat, rhinorrhea, dental problem, sinus pressure and tinnitus.   Eyes: Negative for pain, discharge and visual disturbance.  Respiratory: Positive for cough, shortness of breath and wheezing.   Cardiovascular: Negative for chest pain, palpitations and leg swelling.  Gastrointestinal: Negative for nausea, vomiting, abdominal pain, diarrhea, constipation, blood in stool and abdominal distention.  Genitourinary: Negative for dysuria, urgency, frequency, hematuria, flank pain, vaginal bleeding, vaginal discharge, difficulty urinating, vaginal pain and pelvic pain.  Musculoskeletal: Negative for joint swelling, arthralgias and gait problem.  Skin: Negative for rash.  Neurological: Negative for dizziness, syncope, speech difficulty, weakness, numbness and headaches.  Hematological: Negative for adenopathy.  Psychiatric/Behavioral: Negative for behavioral problems, dysphoric mood and agitation. The patient is not  nervous/anxious.        Objective:   Physical Exam  Constitutional: She is oriented to person, place, and time. She appears well-developed and well-nourished. No distress.  HENT:  Head: Normocephalic.  Right Ear: External ear normal.  Left Ear: External ear normal.  Mouth/Throat: Oropharynx is clear and moist.  Eyes: Conjunctivae and EOM are normal. Pupils are equal, round, and reactive to light.  Neck: Normal range of motion. Neck supple. No thyromegaly present.  Cardiovascular: Normal rate, regular rhythm, normal heart sounds and intact distal pulses.   Pulmonary/Chest: Effort normal. No respiratory distress. She has wheezes. She has no rales.  Abdominal: Soft. Bowel sounds are normal. She exhibits no mass. There is no tenderness.  Musculoskeletal: Normal range of motion. She exhibits no edema.  Lymphadenopathy:    She has no cervical adenopathy.  Neurological: She is alert and oriented to person, place, and time.  Skin: Skin is warm and dry. No rash noted.  Psychiatric: She has a normal mood and affect. Her behavior is normal.          Assessment & Plan:  Acute exacerbation of COPD. Probably viral syndrome. We'll treat with Depo-Medrol 40 we'll place on inhalational medications [facility]) as well as albuterol rescue Hypertension systolic readings have been consistently high will change to Benicar HCT 40- 12.5 daily recheck blood pressure one month Diabetes mellitus. Stable will check a hemoglobin A1c

## 2011-07-18 ENCOUNTER — Ambulatory Visit (INDEPENDENT_AMBULATORY_CARE_PROVIDER_SITE_OTHER): Payer: Medicare Other | Admitting: Cardiology

## 2011-07-18 ENCOUNTER — Encounter: Payer: Self-pay | Admitting: Cardiology

## 2011-07-18 DIAGNOSIS — J449 Chronic obstructive pulmonary disease, unspecified: Secondary | ICD-10-CM

## 2011-07-18 DIAGNOSIS — I6529 Occlusion and stenosis of unspecified carotid artery: Secondary | ICD-10-CM

## 2011-07-18 DIAGNOSIS — R079 Chest pain, unspecified: Secondary | ICD-10-CM

## 2011-07-18 DIAGNOSIS — E785 Hyperlipidemia, unspecified: Secondary | ICD-10-CM

## 2011-07-18 DIAGNOSIS — I10 Essential (primary) hypertension: Secondary | ICD-10-CM

## 2011-07-18 DIAGNOSIS — J45909 Unspecified asthma, uncomplicated: Secondary | ICD-10-CM

## 2011-07-18 DIAGNOSIS — I429 Cardiomyopathy, unspecified: Secondary | ICD-10-CM

## 2011-07-18 MED ORDER — ATORVASTATIN CALCIUM 20 MG PO TABS: 20.0000 mg | ORAL_TABLET | Freq: Every day | ORAL | Status: DC

## 2011-07-18 MED ORDER — AMLODIPINE BESYLATE 5 MG PO TABS: 5.0000 mg | ORAL_TABLET | Freq: Every day | ORAL | Status: DC

## 2011-07-18 NOTE — Patient Instructions (Addendum)
Stop simvastatin.  Start atorvastatin 20mg  daily for your cholesterol.  Start amlodipine 5mg  daily for your blood pressure.  A chest x-ray takes a picture of the organs and structures inside the chest, including the heart, lungs, and blood vessels. This test can show several things, including, whether the heart is enlarges; whether fluid is building up in the lungs; and whether pacemaker / defibrillator leads are still in place.  Tomorrow at the [facility] office.  Schedule an appointment to see the pulmonary doctor next week about your wheezing/asthma.   Take and record your blood pressure. I will call you in 2 weeks to get the readings. Luana Shu [phone]  Your physician recommends that you return for a FASTING lipid profile /liver profile in 2 months--401.9   786.50   Your physician wants you to follow-up in: 6 months with Dr Shirlee Latch. (April 2013) You will receive a reminder letter in the mail two months in advance. If you don't receive a letter, please call our office to schedule the follow-up appointment.      Your physician has requested that you have a carotid duplex. This test is an ultrasound of the carotid arteries in your neck. It looks at blood flow through these arteries that supply the brain with blood. Allow one hour for this exam. There are no restrictions or special instructions. IN ONE YEAR (October 2013).

## 2011-07-19 ENCOUNTER — Ambulatory Visit (INDEPENDENT_AMBULATORY_CARE_PROVIDER_SITE_OTHER)
Admission: RE | Admit: 2011-07-19 | Discharge: 2011-07-19 | Disposition: A | Discharge status: Home / Self Care | Payer: Medicare Other | Source: Ambulatory Visit | Attending: Cardiology | Admitting: Cardiology

## 2011-07-19 DIAGNOSIS — I6529 Occlusion and stenosis of unspecified carotid artery: Secondary | ICD-10-CM | POA: Insufficient documentation

## 2011-07-19 DIAGNOSIS — I10 Essential (primary) hypertension: Secondary | ICD-10-CM

## 2011-07-19 DIAGNOSIS — J45909 Unspecified asthma, uncomplicated: Secondary | ICD-10-CM

## 2011-07-19 DIAGNOSIS — J449 Chronic obstructive pulmonary disease, unspecified: Secondary | ICD-10-CM | POA: Insufficient documentation

## 2011-07-19 DIAGNOSIS — R079 Chest pain, unspecified: Secondary | ICD-10-CM

## 2011-07-19 NOTE — Assessment & Plan Note (Signed)
40-59% RICA stenosis on recent carotid dopplers.  Repeat in 10/13.

## 2011-07-19 NOTE — Assessment & Plan Note (Signed)
Goal LDL < 70 with vascular disease (carotid stenosis).  D/c simvastatin and start atorvastatin 20 mg daily (cannot take simva 40 in association with amlodipine 5).

## 2011-07-19 NOTE — Progress Notes (Signed)
PCP: Dr. Amador Cunas  77 yo with diabetes, HTN, and hyperlipidemia as well as history of presumed Takotsubo cardiomyopathy presents for cardiology followup.  Last echo showed EF back to 50-55%.   Patient was seen recently by Tereso Newcomer.  She had developed increased dyspnea associated with wheezing and pleuritic chest pain.  Dobutamine myoview was done, showing EF 51% and no evidence for ischemia/infarction.  She then saw Dr. Amador Cunas and was given a steroid injection and 2 inhalers to treat an asthma exacerbation.  She is breathing better but is still wheezing.  SBP continues to run high.  It has been in the 160s-170s when she checks at home.   Labs (10/11): K 4.7, creatinine 0.8, LDL 86, HDL 49, TSH normal  Labs (1/61): K 4.4, creatinine 0.9, BNP 82  Allergies:  1) ! Sulfa  2) ! Penicillin G Potassium (Penicillin G Potassium)  3) ! Streptomycin Sulfate (Streptomycin Sulfate)  4) ! Buprenex (Buprenorphine Hcl)  5) ! * Ivp Dye   Past Medical History:  1. Takotsubo cardiomyopathy: Sister died in 2023-04-09 and patient developed CP with ECG changes and evidence for NSTEMI. LHC showed mild nonobstructive CAD with mildly decreased LV systolic function. Echo in 2/11 showed EF 40-45% with diffuse hypokinesis. Echo (2/12) showed EF 50-55% with no significant valvular abnormalities and normal RV.  2. LHC 04-09-2023): Luminal irregularities.  3. HIATAL HERNIA (ICD-553.3) and GERD  4. DIVERTICULOSIS OF COLON (ICD-562.10): History of GI bleeding.  5. ARTERIOVENOUS MALFORMATION (ICD-747.60)  6. NEPHROLITHIASIS, HX OF (ICD-V13.01)  7. ANEMIA NEC (ICD-285.8)  8. OSTEOARTHRITIS (ICD-715.90)  9. Hyperlipidemia  10. Diabetes.  11. HTN  12. ALLERGIC RHINITIS, SEASONAL (ICD-477.0)  13. DEGENERATIVE JOINT DISEASE, CERVICAL SPINE (ICD-721.90)  14. Depression  15. Dobutamine myoview (10/12): EF 51%, no ischemia or infarction.  16. Carotid stenosis: Carotid dopplers (10/12) with 40-59% RICA.  17. Asthma  Family  History:  Father died at 66 of an MI  mother at 37 of an MI and had congestive heart failure  one brother died at 74 of an MI  two sisters, one status post CABG  Family History of Diabetes: Sister, Father  Family History of Breast Cancer: Sister, 2 aunts   Social History:  Regular exercise-yes  Patient is a former smoker (quit in the 41s) widowed 06-2010  Works for [employer], [employer]/film/video games   Review of Systems  All systems reviewed and negative except as per HPI.   Current Outpatient Prescriptions  Medication Sig Dispense Refill  . aspirin 81 MG tablet Take 81 mg by mouth daily.        . calcium gluconate 500 MG tablet Take 500 mg by mouth daily.        . carvedilol (COREG) 12.5 MG tablet Take 12.5 mg by mouth 2 (two) times daily with a meal.        . Cholecalciferol (VITAMIN D) 400 UNITS capsule Take 400 Units by mouth daily.        . citalopram (CELEXA) 40 MG tablet TAKE 1 TABLET BY MOUTH EVERY DAY  90 tablet  3  . ferrous sulfate 325 (65 FE) MG EC tablet Take 325 mg by mouth daily with breakfast.        . glucosamine-chondroitin 500-400 MG tablet Take 1 tablet by mouth daily.        Marland Kitchen glucose blood (FREESTYLE TEST STRIPS) test strip 1 each by Other route daily. Use as instructed       . LORazepam (ATIVAN) 0.5  MG tablet Take 0.5 mg by mouth 2 (two) times daily as needed.        . metFORMIN (GLUCOPHAGE-XR) 500 MG 24 hr tablet TAKE 2 TABLETS BY MOUTH AT BEDTIME  180 tablet  4  . olmesartan-hydrochlorothiazide (BENICAR HCT) 40-12.5 MG per tablet Take 1 tablet by mouth daily.  90 tablet  6  . Omega-3 Fatty Acids (FISH OIL) 1000 MG CAPS Take by mouth 2 (two) times daily.        Marland Kitchen omeprazole (PRILOSEC) 20 MG capsule TAKE 1 CAPSULE EVERY DAY  90 capsule  3  . traZODone (DESYREL) 100 MG tablet Take 100 mg by mouth at bedtime.        . vitamin E 400 UNIT capsule Take 400 Units by mouth daily.        Marland Kitchen amLODipine (NORVASC) 5 MG tablet Take 1 tablet (5 mg total) by mouth  daily.  30 tablet  6  . atorvastatin (LIPITOR) 20 MG tablet Take 1 tablet (20 mg total) by mouth daily.  30 tablet  3    BP 162/70  Pulse 68  Ht 5' 4.5" (1.638 m)  Wt 177 lb (80.287 kg)  BMI 29.91 kg/m2 General: NAD Neck: No JVD, no thyromegaly or thyroid nodule.  Lungs: Prolonged expiratory phase with diffuse expiratory wheezing. CV: Nondisplaced PMI.  Heart regular S1/S2, no S3/S4, no murmur.  No peripheral edema.  No carotid bruit.  Normal pedal pulses.  Abdomen: Soft, nontender, no hepatosplenomegaly, no distention.  Neurologic: Alert and oriented x 3.  Psych: Normal affect. Extremities: No clubbing or cyanosis.

## 2011-07-19 NOTE — Assessment & Plan Note (Signed)
Takotsubo cardiomyopathy in the past with recovery of EF.

## 2011-07-19 NOTE — Assessment & Plan Note (Addendum)
Patient has symptoms and exam consistent with an asthma exacerbation.  She quit smoking over 25 years ago.  She feels better after a steroid injection and starting 2 inhalers but she is still very wheezy on exam.  She asks for a pulmonary referral, which I think is quite reasonable.  I will get a CXR given prolonged symptoms.

## 2011-07-19 NOTE — Assessment & Plan Note (Signed)
BP still too high.  Add amlodipine 5 mg daily with BP check in 2 wks.

## 2011-07-25 ENCOUNTER — Ambulatory Visit: Payer: Medicare Other | Admitting: Internal Medicine

## 2011-07-26 ENCOUNTER — Other Ambulatory Visit: Payer: Self-pay | Admitting: Dermatology

## 2011-07-26 ENCOUNTER — Ambulatory Visit: Payer: Medicare Other | Admitting: Internal Medicine

## 2011-07-29 ENCOUNTER — Ambulatory Visit (HOSPITAL_COMMUNITY)
Admission: RE | Admit: 2011-07-29 | Discharge: 2011-07-29 | Disposition: A | Discharge status: Home / Self Care | Payer: Medicare Other | Source: Ambulatory Visit | Attending: Urology | Admitting: Urology

## 2011-07-29 ENCOUNTER — Encounter: Payer: Medicare Other | Admitting: *Deleted

## 2011-07-29 ENCOUNTER — Institutional Professional Consult (permissible substitution): Payer: Medicare Other | Admitting: Internal Medicine

## 2011-07-29 ENCOUNTER — Encounter (HOSPITAL_COMMUNITY): Payer: Medicare Other | Admitting: Radiology

## 2011-07-29 DIAGNOSIS — M129 Arthropathy, unspecified: Secondary | ICD-10-CM | POA: Insufficient documentation

## 2011-07-29 DIAGNOSIS — N200 Calculus of kidney: Secondary | ICD-10-CM | POA: Insufficient documentation

## 2011-07-29 DIAGNOSIS — Z79899 Other long term (current) drug therapy: Secondary | ICD-10-CM | POA: Insufficient documentation

## 2011-07-29 DIAGNOSIS — E119 Type 2 diabetes mellitus without complications: Secondary | ICD-10-CM | POA: Insufficient documentation

## 2011-07-29 DIAGNOSIS — I252 Old myocardial infarction: Secondary | ICD-10-CM | POA: Insufficient documentation

## 2011-07-29 DIAGNOSIS — Z01818 Encounter for other preprocedural examination: Secondary | ICD-10-CM | POA: Insufficient documentation

## 2011-07-29 DIAGNOSIS — J45909 Unspecified asthma, uncomplicated: Secondary | ICD-10-CM | POA: Insufficient documentation

## 2011-07-29 DIAGNOSIS — I10 Essential (primary) hypertension: Secondary | ICD-10-CM | POA: Insufficient documentation

## 2011-07-31 ENCOUNTER — Other Ambulatory Visit: Payer: Self-pay | Admitting: Internal Medicine

## 2011-07-31 ENCOUNTER — Ambulatory Visit: Payer: Medicare Other | Admitting: Internal Medicine

## 2011-07-31 DIAGNOSIS — Z1231 Encounter for screening mammogram for malignant neoplasm of breast: Secondary | ICD-10-CM

## 2011-08-01 ENCOUNTER — Encounter: Payer: Self-pay | Admitting: Internal Medicine

## 2011-08-01 ENCOUNTER — Ambulatory Visit (INDEPENDENT_AMBULATORY_CARE_PROVIDER_SITE_OTHER): Payer: Medicare Other | Admitting: Internal Medicine

## 2011-08-01 VITALS — BP 118/70 | HR 66 | Temp 97.9°F | Wt 179.4 lb

## 2011-08-01 DIAGNOSIS — J449 Chronic obstructive pulmonary disease, unspecified: Secondary | ICD-10-CM

## 2011-08-01 NOTE — Progress Notes (Signed)
  Subjective:    Patient ID: Leslie [name], female    DOB: [birth year], 77 y.o.   MRN: [id number]  HPI  31 yowf quit smoking around 1980 due to sob but much better p  quit even with colds then started up with audible wheezing onset Oct 2012 > referred to pulmonary clinic by Drs Shirlee Latch and [facility].   08/01/2011 Initial pulmonary office eval cc new onset wheezing and mostly dry coughing x 2weeks abrupt onset seems to be improving on its own although took inhalers (proaire and dulera)  for a few days but stopped x 10 days before the ov and no significant flare off inhalers and on coreg.  Presently  Still mild doe and also overt reflux symptoms despite prilosec dosed ac q am while on fish oil and vit e in oil base.   No h/o  Allergies, asthma or sinus complaints, no purulent sputum  Sleeping ok without nocturnal  or early am exacerbation  of respiratory  c/o's or need for noct saba. Also denies any obvious fluctuation of symptoms with weather or environmental changes or other aggravating or alleviating factors except as outlined above    Review of Systems  Constitutional: Negative for fever and unexpected weight change.  HENT: Negative for ear pain, nosebleeds, congestion, sore throat, rhinorrhea, sneezing, trouble swallowing, dental problem, postnasal drip and sinus pressure.   Eyes: Negative for redness and itching.  Respiratory: Positive for shortness of breath. Negative for cough, chest tightness and wheezing.   Cardiovascular: Negative for palpitations and leg swelling.  Gastrointestinal: Negative for nausea and vomiting.  Genitourinary: Negative for dysuria.  Musculoskeletal: Negative for joint swelling.  Skin: Negative for rash.  Neurological: Negative for headaches.  Hematological: Does not bruise/bleed easily.  Psychiatric/Behavioral: Positive for dysphoric mood. The patient is not nervous/anxious.        Objective:   Physical Exam  Wt 179 08/01/11  amb wf nad  HEENT mild  turbinate edema.  Oropharynx no thrush or excess pnd or cobblestoning.  No JVD or cervical adenopathy. Mild accessory muscle hypertrophy. Trachea midline, nl thryroid. Chest was hyperinflated by percussion with diminished breath sounds and min increased exp time without wheeze. Hoover sign positive at end inspiration. Regular rate and rhythm without murmur gallop or rub or increase P2 or edema.  Abd: no hsm, nl excursion. Ext warm without cyanosis or clubbing.    cxr 07/19/11 Findings: Trachea is midline. Heart size stable. Scarring in the  lingula and right middle lobe. Lungs are otherwise clear. No  pleural fluid. Lower thoracic vertebroplasty is noted.  IMPRESSION:  No acute findings      Assessment & Plan:

## 2011-08-01 NOTE — Patient Instructions (Addendum)
GERD (REFLUX)  is an extremely common cause of respiratory symptoms, many times with no significant heartburn at all.    It can be treated with medication, but also with lifestyle changes including avoidance of late meals, excessive alcohol, smoking cessation, and avoid fatty foods, chocolate, peppermint, colas, red wine, and acidic juices such as orange juice.  NO MINT OR MENTHOL PRODUCTS SO NO COUGH DROPS  USE SUGARLESS CANDY INSTEAD (jolley ranchers or Stover's)  NO OIL BASED VITAMINS - use powdered substitutes.    Please schedule a follow up office visit in 4 weeks, sooner if needed with pft's.

## 2011-08-02 NOTE — Assessment & Plan Note (Addendum)
  When respiratory symptoms begin well after a patient reports complete smoking cessation,  it is very hard to "blame" COPD specifically or airways disorders in general  ie it doesn't make any more sense than hearing a  [employer]SC[employer] driver wrecked his car while driving his kids to school or a surgeon sliced his hand off carving roast beef (it must be rare indeed!)     That is to say, once the high risk activity stops,  the symptoms should not suddenly erupt or markedly worsen.  If so, the differential diagnosis should include  obesity/deconditioning,  LPR/Reflux/Aspiration syndromes,  occult CHF, or  especially side effect of medications commonly used in this population, especially ace inhibitors and non-specific Beta blockers and Oil based vitamins, which raise the possibility of  non-acid gerd issues and irritation of the upper airway.  She seems much better now so all I recommend is gerd diet and f/u with baseline pfts for future reference  rec See instructions for specific recommendations which were reviewed directly with the patient who was given a copy with highlighter outlining the key components.

## 2011-08-05 ENCOUNTER — Telehealth: Payer: Self-pay | Admitting: *Deleted

## 2011-08-05 NOTE — Telephone Encounter (Signed)
I talked with pt. Recent BP readings-- 113/55 119/52 101/48 118/70 133/68 131/55 130/70 145/54 134/70 117/61 highest reading 07/26/11 142/90 (a lot of pain from kidney stone)--pt feels like she has more energy and update.

## 2011-08-05 NOTE — Telephone Encounter (Signed)
BPs better.

## 2011-08-05 NOTE — Telephone Encounter (Signed)
Pt feels like she is more energetic. I will forward to Dr Shirlee Latch for review.

## 2011-08-05 NOTE — Telephone Encounter (Signed)
HYPERTENSION - Marca Ancona, MD 07/19/2011 11:49 PM Signed  BP still too high. Add amlodipine 5 mg daily with BP check in 2 wks.

## 2011-08-05 NOTE — Telephone Encounter (Signed)
I talked with pt. 

## 2011-08-06 ENCOUNTER — Ambulatory Visit: Payer: Medicare Other | Admitting: Cardiology

## 2011-08-08 ENCOUNTER — Ambulatory Visit: Payer: Medicare Other | Admitting: Internal Medicine

## 2011-08-29 ENCOUNTER — Other Ambulatory Visit: Payer: Self-pay | Admitting: *Deleted

## 2011-08-29 ENCOUNTER — Telehealth: Payer: Self-pay | Admitting: *Deleted

## 2011-08-29 DIAGNOSIS — F419 Anxiety disorder, unspecified: Secondary | ICD-10-CM

## 2011-08-29 NOTE — Telephone Encounter (Signed)
patient  Is calling because she is in [place] and needs #6 tabs of refills.  She is there because her daughter had a baby, but now she is sick and had to stay a little longer.  She is requesting 6 tabs of Trazodone and Celebrex to last until she gets home.  She understands that she will have to pay out of pocket for this.  If possible please send to [facility] [phone].

## 2011-08-29 NOTE — Telephone Encounter (Signed)
patient  Is calling because she is in [place] and she needs refills.

## 2011-08-29 NOTE — Telephone Encounter (Signed)
error 

## 2011-08-30 MED ORDER — TRAZODONE HCL 100 MG PO TABS: 100.0000 mg | ORAL_TABLET | Freq: Every day | ORAL | Status: DC

## 2011-08-30 MED ORDER — CITALOPRAM HYDROBROMIDE 40 MG PO TABS: 40.0000 mg | ORAL_TABLET | Freq: Every day | ORAL | Status: DC

## 2011-08-30 NOTE — Telephone Encounter (Signed)
Pt called back to check on status of getting #6 on Trazodone and # 6 on Celebrex called in to [facility] in [place]. The # is [phone]. Pls call these in today, because pt is out of med and needs to take tonight.

## 2011-08-30 NOTE — Telephone Encounter (Signed)
[facility] [place].  Spoke with pt - celexa and trazadone - short term - done

## 2011-09-04 ENCOUNTER — Ambulatory Visit: Payer: Medicare Other

## 2011-09-06 ENCOUNTER — Encounter: Payer: Self-pay | Admitting: Internal Medicine

## 2011-09-06 ENCOUNTER — Ambulatory Visit (INDEPENDENT_AMBULATORY_CARE_PROVIDER_SITE_OTHER): Payer: Medicare Other | Admitting: Internal Medicine

## 2011-09-06 DIAGNOSIS — J449 Chronic obstructive pulmonary disease, unspecified: Secondary | ICD-10-CM

## 2011-09-06 DIAGNOSIS — E785 Hyperlipidemia, unspecified: Secondary | ICD-10-CM

## 2011-09-06 DIAGNOSIS — R079 Chest pain, unspecified: Secondary | ICD-10-CM

## 2011-09-06 DIAGNOSIS — E119 Type 2 diabetes mellitus without complications: Secondary | ICD-10-CM

## 2011-09-06 DIAGNOSIS — J45909 Unspecified asthma, uncomplicated: Secondary | ICD-10-CM

## 2011-09-06 DIAGNOSIS — I10 Essential (primary) hypertension: Secondary | ICD-10-CM

## 2011-09-06 DIAGNOSIS — F411 Generalized anxiety disorder: Secondary | ICD-10-CM

## 2011-09-06 DIAGNOSIS — F419 Anxiety disorder, unspecified: Secondary | ICD-10-CM

## 2011-09-06 DIAGNOSIS — J069 Acute upper respiratory infection, unspecified: Secondary | ICD-10-CM

## 2011-09-06 LAB — CBC WITH DIFFERENTIAL/PLATELET
Basophils Relative: 0.3 % (ref 0.0–3.0)
Eosinophils Relative: 5.5 % — ABNORMAL HIGH (ref 0.0–5.0)
MCV: 95.1 fl (ref 78.0–100.0)
Monocytes Absolute: 0.7 10*3/uL (ref 0.1–1.0)
Monocytes Relative: 9 % (ref 3.0–12.0)
Neutrophils Relative %: 65.9 % (ref 43.0–77.0)
Platelets: 274 10*3/uL (ref 150.0–400.0)
RBC: 3.85 Mil/uL — ABNORMAL LOW (ref 3.87–5.11)
WBC: 7.5 10*3/uL (ref 4.5–10.5)

## 2011-09-06 LAB — COMPREHENSIVE METABOLIC PANEL
AST: 18 U/L (ref 0–37)
Alkaline Phosphatase: 88 U/L (ref 39–117)
BUN: 20 mg/dL (ref 6–23)
Calcium: 9.2 mg/dL (ref 8.4–10.5)
Chloride: 108 mEq/L (ref 96–112)
Creatinine, Ser: 0.9 mg/dL (ref 0.4–1.2)
Glucose, Bld: 127 mg/dL — ABNORMAL HIGH (ref 70–99)

## 2011-09-06 LAB — LIPID PANEL
Cholesterol: 134 mg/dL (ref 0–200)
LDL Cholesterol: 67 mg/dL (ref 0–99)
Triglycerides: 69 mg/dL (ref 0.0–149.0)
VLDL: 13.8 mg/dL (ref 0.0–40.0)

## 2011-09-06 LAB — TSH: TSH: 2.61 u[IU]/mL (ref 0.35–5.50)

## 2011-09-06 LAB — HEMOGLOBIN A1C: Hgb A1c MFr Bld: 6.6 % — ABNORMAL HIGH (ref 4.6–6.5)

## 2011-09-06 MED ORDER — GLUCOSE BLOOD VI STRP: ORAL_STRIP | Status: DC

## 2011-09-06 MED ORDER — CARVEDILOL 12.5 MG PO TABS: 12.5000 mg | ORAL_TABLET | Freq: Two times a day (BID) | ORAL | Status: DC

## 2011-09-06 MED ORDER — CITALOPRAM HYDROBROMIDE 40 MG PO TABS: 40.0000 mg | ORAL_TABLET | Freq: Every day | ORAL | Status: DC

## 2011-09-06 MED ORDER — TRAZODONE HCL 100 MG PO TABS: 100.0000 mg | ORAL_TABLET | Freq: Every day | ORAL | Status: DC

## 2011-09-06 MED ORDER — ALFUZOSIN HCL ER 10 MG PO TB24: 10.0000 mg | ORAL_TABLET | Freq: Every day | ORAL | Status: DC

## 2011-09-06 MED ORDER — METFORMIN HCL ER 500 MG PO TB24: 500.0000 mg | ORAL_TABLET | Freq: Every day | ORAL | Status: DC

## 2011-09-06 MED ORDER — MOMETASONE FURO-FORMOTEROL FUM 200-5 MCG/ACT IN AERO: 1.0000 | INHALATION_SPRAY | RESPIRATORY_TRACT | Status: DC | PRN

## 2011-09-06 MED ORDER — ATORVASTATIN CALCIUM 20 MG PO TABS: 20.0000 mg | ORAL_TABLET | Freq: Every day | ORAL | Status: DC

## 2011-09-06 MED ORDER — METHYLPREDNISOLONE ACETATE 80 MG/ML IJ SUSP
80.0000 mg | Freq: Once | INTRAMUSCULAR | Status: AC
  Administered 2011-09-06: 80 mg via INTRAMUSCULAR

## 2011-09-06 MED ORDER — LORAZEPAM 0.5 MG PO TABS: 0.5000 mg | ORAL_TABLET | Freq: Two times a day (BID) | ORAL | Status: DC | PRN

## 2011-09-06 MED ORDER — OMEPRAZOLE 20 MG PO CPDR: 20.0000 mg | DELAYED_RELEASE_CAPSULE | Freq: Every day | ORAL | Status: DC

## 2011-09-06 MED ORDER — HYDROCODONE-ACETAMINOPHEN 7.5-325 MG PO TABS: 1.0000 | ORAL_TABLET | Freq: Four times a day (QID) | ORAL | Status: DC | PRN

## 2011-09-06 MED ORDER — OLMESARTAN MEDOXOMIL-HCTZ 40-12.5 MG PO TABS: 1.0000 | ORAL_TABLET | Freq: Every day | ORAL | Status: DC

## 2011-09-06 MED ORDER — ALBUTEROL SULFATE HFA 108 (90 BASE) MCG/ACT IN AERS: 2.0000 | INHALATION_SPRAY | Freq: Four times a day (QID) | RESPIRATORY_TRACT | Status: DC | PRN

## 2011-09-06 MED ORDER — AMLODIPINE BESYLATE 5 MG PO TABS: 5.0000 mg | ORAL_TABLET | Freq: Every day | ORAL | Status: DC

## 2011-09-06 NOTE — Progress Notes (Signed)
  Subjective:    Patient ID: Leslie [name], female    DOB: 1932-10-09, 77 y.o.   MRN: [id number]  HPI 77 year old patient who has a history of COPD with a bronchospastic component.  She has a remote history of tobacco use and is scheduled for a pulmonary function studies soon. She is followed by pulmonary medicine as well as cardiology for an ischemic cardiomyopathy. She has treated hypertension and type 2 diabetes.  She was treated for bronchitis with azithromycin in [place]. She has been ill for approximately 10 days and has completed antibiotic therapy 4 days ago. She continues to have cough with shortness of breath and wheezing. No fever chills or sputum production. She has been on aggressive antitussives which includes narcotics and Tessalon for her cough. She denies any chest pain. She is maintained nice glycemic control and a fasting blood sugar 107 this morning     Review of Systems  Constitutional: Positive for fatigue.  HENT: Positive for congestion and postnasal drip. Negative for hearing loss, sore throat, rhinorrhea, dental problem, sinus pressure and tinnitus.   Eyes: Negative for pain, discharge and visual disturbance.  Respiratory: Positive for cough, shortness of breath and wheezing. Negative for stridor.   Cardiovascular: Negative for chest pain, palpitations and leg swelling.  Gastrointestinal: Negative for nausea, vomiting, abdominal pain, diarrhea, constipation, blood in stool and abdominal distention.  Genitourinary: Negative for dysuria, urgency, frequency, hematuria, flank pain, vaginal bleeding, vaginal discharge, difficulty urinating, vaginal pain and pelvic pain.  Musculoskeletal: Negative for joint swelling, arthralgias and gait problem.  Skin: Negative for rash.  Neurological: Negative for dizziness, syncope, speech difficulty, weakness, numbness and headaches.  Hematological: Negative for adenopathy.  Psychiatric/Behavioral: Negative for behavioral problems,  dysphoric mood and agitation. The patient is not nervous/anxious.        Objective:   Physical Exam  Constitutional: She is oriented to person, place, and time. She appears well-developed and well-nourished. No distress.       Afebrile Frequent paroxysms of coughing Blood pressure 114/70 O2 saturation 96 Pulse 72  HENT:  Head: Normocephalic.  Right Ear: External ear normal.  Left Ear: External ear normal.  Mouth/Throat: Oropharynx is clear and moist.  Eyes: Conjunctivae and EOM are normal. Pupils are equal, round, and reactive to light.  Neck: Normal range of motion. Neck supple. No thyromegaly present.  Cardiovascular: Normal rate, regular rhythm, normal heart sounds and intact distal pulses.   Pulmonary/Chest: Effort normal. She has wheezes.       Prolonged expiratory phase with coarse rhonchi and some faint wheezing  Abdominal: Soft. Bowel sounds are normal. She exhibits no mass. There is no tenderness.  Musculoskeletal: Normal range of motion.  Lymphadenopathy:    She has no cervical adenopathy.  Neurological: She is alert and oriented to person, place, and time.  Skin: Skin is warm and dry. No rash noted.  Psychiatric: She has a normal mood and affect. Her behavior is normal.          Assessment & Plan:  Acute exacerbation of COPD. The patient has completed azithromycin therapy. Will refill albuterol to use every 6 hours we'll treat with Depo-Medrol 80 mg IM and placed on short-term inhalational steroids. We'll postpone pulmonary function studies until after the first of the year. We'll continue expectorants and antitussives. Will call if she develops any worsening wheezing or shortness of breath Hypertension stable Diabetes mellitus stable  Lab update today

## 2011-09-06 NOTE — Patient Instructions (Signed)
Get plenty of rest, Drink lots of  clear liquids, and use Tylenol or  for fever and discomfort.    Call or return to clinic prn if these symptoms worsen or fail to improve as anticipated.  Schedule pulmonary function studies after the first of the year   Please check your hemoglobin A1c every 3 months

## 2011-09-06 NOTE — Progress Notes (Signed)
Addended by: Duard Brady I on: 09/06/2011 10:54 AM   Modules accepted: Orders

## 2011-09-10 ENCOUNTER — Ambulatory Visit
Admission: RE | Admit: 2011-09-10 | Discharge: 2011-09-10 | Disposition: A | Discharge status: Home / Self Care | Payer: Medicare Other | Source: Ambulatory Visit | Attending: Internal Medicine | Admitting: Internal Medicine

## 2011-09-10 ENCOUNTER — Telehealth: Payer: Self-pay | Admitting: Internal Medicine

## 2011-09-10 ENCOUNTER — Telehealth: Payer: Self-pay | Admitting: Cardiology

## 2011-09-10 DIAGNOSIS — E875 Hyperkalemia: Secondary | ICD-10-CM

## 2011-09-10 DIAGNOSIS — Z1231 Encounter for screening mammogram for malignant neoplasm of breast: Secondary | ICD-10-CM

## 2011-09-10 NOTE — Telephone Encounter (Signed)
Patient states she was scheduled for repeat blood work on 09/13/11, she had labs done on 09/06/11 at Dr. Vernon Prey office. Patient wants for Thurston Hole to know. Appointment cancelled as requested per pt.

## 2011-09-10 NOTE — Telephone Encounter (Signed)
Pt would like a callback today. Pt was told her potassium was 5.6 ,she need her blood work results.

## 2011-09-10 NOTE — Telephone Encounter (Signed)
Called and left a detailed message.

## 2011-09-10 NOTE — Telephone Encounter (Signed)
New Msg: Pt calling stating that she did blood work at Dr. Charm Rings office and therefore doesn't need to keep lab appt for this Friday. Please call Dr. Charm Rings office to get the results of pt lab work. Please return pt call to discuss further if necessary.

## 2011-09-11 ENCOUNTER — Ambulatory Visit: Payer: Medicare Other | Admitting: Internal Medicine

## 2011-09-13 ENCOUNTER — Other Ambulatory Visit: Payer: Medicare Other | Admitting: *Deleted

## 2011-09-13 NOTE — Telephone Encounter (Signed)
Notified of lab results and recommendations. Pt will return for BMET 09/17/11.

## 2011-09-13 NOTE — Telephone Encounter (Signed)
Good lipids. K high and may have been hemolyzed, should be repeated.  ----- Message ----- From: Jacqlyn Krauss, RN Sent: 09/11/2011 6:02 PM To: Marca Ancona, MD  Will you look at lipid in EPIC done 09/06/11 ordered by Dr Amador Cunas?

## 2011-09-14 ENCOUNTER — Other Ambulatory Visit: Payer: Self-pay | Admitting: Internal Medicine

## 2011-09-17 ENCOUNTER — Other Ambulatory Visit (INDEPENDENT_AMBULATORY_CARE_PROVIDER_SITE_OTHER): Payer: Medicare Other | Admitting: *Deleted

## 2011-09-17 DIAGNOSIS — E875 Hyperkalemia: Secondary | ICD-10-CM

## 2011-09-17 LAB — BASIC METABOLIC PANEL
GFR: 48.68 mL/min — ABNORMAL LOW (ref >60.00)
Potassium: 4.3 mEq/L (ref 3.5–5.1)
Sodium: 143 mEq/L (ref 135–145)

## 2011-10-07 ENCOUNTER — Emergency Department (HOSPITAL_COMMUNITY)
Admission: EM | Admit: 2011-10-07 | Discharge: 2011-10-07 | Disposition: A | Discharge status: Home / Self Care | Payer: Medicare Other | Attending: Emergency Medicine | Admitting: Emergency Medicine

## 2011-10-07 ENCOUNTER — Encounter (HOSPITAL_COMMUNITY): Payer: Self-pay | Admitting: Emergency Medicine

## 2011-10-07 DIAGNOSIS — K219 Gastro-esophageal reflux disease without esophagitis: Secondary | ICD-10-CM | POA: Insufficient documentation

## 2011-10-07 DIAGNOSIS — M5481 Occipital neuralgia: Secondary | ICD-10-CM

## 2011-10-07 DIAGNOSIS — I252 Old myocardial infarction: Secondary | ICD-10-CM | POA: Insufficient documentation

## 2011-10-07 DIAGNOSIS — F3289 Other specified depressive episodes: Secondary | ICD-10-CM | POA: Insufficient documentation

## 2011-10-07 DIAGNOSIS — R51 Headache: Secondary | ICD-10-CM | POA: Insufficient documentation

## 2011-10-07 DIAGNOSIS — Z79899 Other long term (current) drug therapy: Secondary | ICD-10-CM | POA: Insufficient documentation

## 2011-10-07 DIAGNOSIS — M1990 Unspecified osteoarthritis, unspecified site: Secondary | ICD-10-CM | POA: Insufficient documentation

## 2011-10-07 DIAGNOSIS — E785 Hyperlipidemia, unspecified: Secondary | ICD-10-CM | POA: Insufficient documentation

## 2011-10-07 DIAGNOSIS — E119 Type 2 diabetes mellitus without complications: Secondary | ICD-10-CM | POA: Insufficient documentation

## 2011-10-07 DIAGNOSIS — F329 Major depressive disorder, single episode, unspecified: Secondary | ICD-10-CM | POA: Insufficient documentation

## 2011-10-07 DIAGNOSIS — M47812 Spondylosis without myelopathy or radiculopathy, cervical region: Secondary | ICD-10-CM | POA: Insufficient documentation

## 2011-10-07 DIAGNOSIS — H538 Other visual disturbances: Secondary | ICD-10-CM | POA: Insufficient documentation

## 2011-10-07 DIAGNOSIS — M531 Cervicobrachial syndrome: Secondary | ICD-10-CM | POA: Insufficient documentation

## 2011-10-07 LAB — POCT I-STAT, CHEM 8
BUN: 31 mg/dL — ABNORMAL HIGH (ref 6–23)
Creatinine, Ser: 1.2 mg/dL — ABNORMAL HIGH (ref 0.50–1.10)
Glucose, Bld: 104 mg/dL — ABNORMAL HIGH (ref 70–99)
Hemoglobin: 13.3 g/dL (ref 12.0–15.0)
Potassium: 4.9 mEq/L (ref 3.5–5.1)

## 2011-10-07 MED ORDER — CARBAMAZEPINE ER 100 MG PO TB12: 100.0000 mg | ORAL_TABLET | Freq: Two times a day (BID) | ORAL | Status: DC

## 2011-10-07 MED ORDER — TRIAMCINOLONE ACETONIDE 10 MG/ML IJ SUSP
1.0000 mg | Freq: Once | INTRAMUSCULAR | Status: AC
  Administered 2011-10-07: 1 mg via INTRADERMAL
  Filled 2011-10-07: qty 0.1

## 2011-10-07 MED ORDER — LIDOCAINE HCL 2 % IJ SOLN
5.0000 mL | Freq: Once | INTRAMUSCULAR | Status: AC
  Administered 2011-10-07: 100 mg via INTRADERMAL

## 2011-10-07 MED ORDER — LIDOCAINE HCL 2 % IJ SOLN
INTRAMUSCULAR | Status: AC
  Administered 2011-10-07: 100 mg via INTRADERMAL
  Filled 2011-10-07: qty 1

## 2011-10-07 NOTE — ED Provider Notes (Cosign Needed)
History     CSN: [id number]  Arrival date & time 10/07/11  1104   First MD Initiated Contact with Patient 10/07/11 1156      Chief Complaint  Patient presents with  . Headache    (Consider location/radiation/quality/duration/timing/severity/associated sxs/prior treatment) HPI Complains of headache at right occipital area onset 3 days ago headache is intermittent icing a split second at a time. On days one and 2 of the headache had approximately 2 episodes per day. Yesterday she had blurred vision lasting 2 seconds with an episode of the pain. Pain is lancinating and extremely brief nonradiating. Presents today as she had approximately 25 episodes of similar pain this morning. No treatment prior to coming here. Nothing makes symptoms better or worse. Pain is nonradiating severe when present but self-limiting and extremely brief in duration Past Medical History  Diagnosis Date  . ALLERGIC RHINITIS, SEASONAL 05/06/2007  . ANEMIA NEC 05/20/2007  . ARTERIOVENOUS MALFORMATION 09/20/2008  . BACK PAIN, LUMBAR 09/21/2008  . CARDIOMYOPATHY, SECONDARY 11/21/2008  . DEGENERATIVE JOINT DISEASE, CERVICAL SPINE 05/06/2007  . DEPRESSION 10/31/2009  . DIABETES MELLITUS, TYPE II 05/06/2007  . DIVERTICULOSIS OF COLON 09/20/2008  . G I BLEED 10/14/2007  . GERD 05/06/2007  . HIATAL HERNIA 09/20/2008  . HYPERLIPIDEMIA 05/06/2007  . HYPERTENSION 05/06/2007  . MYOCARDIAL INFARCTION, HX OF 05/06/2007  . NEPHROLITHIASIS, HX OF 07/14/2007  . OSTEOARTHRITIS 05/06/2007  . PHLEBITIS&THROMBOPHLEB SUP VEINS UPPER EXTREM 07/25/2009  . TOBACCO USE, QUIT 07/20/2009  . Blood type O+     Past Surgical History  Procedure Date  . Abdominal hysterectomy   . Appendectomy     Family History  Problem Relation Age of Onset  . Heart disease Mother   . Heart failure Mother   . Heart attack Father   . Heart attack Brother     History  Substance Use Topics  . Smoking status: Former Smoker -- 1.0 packs/day for 30 years    Types:  Cigarettes    Quit date: 07/15/1984  . Smokeless tobacco: Never Used  . Alcohol Use: Yes     2-3 glasses of wine    OB History    Grav Para Term Preterm Abortions TAB SAB Ect Mult Living                  Review of Systems  Constitutional: Negative.   Eyes: Positive for visual disturbance.  Respiratory: Negative.   Cardiovascular: Negative.   Gastrointestinal: Negative.   Musculoskeletal: Negative.   Skin: Negative.   Neurological: Positive for headaches.  Hematological: Negative.   Psychiatric/Behavioral: Negative.   All other systems reviewed and are negative.    Allergies  Ivp dye; Buprenorphine hcl; Macrolides and ketolides; Penicillins; Streptomycin; and Sulfonamide derivatives  Home Medications   Current Outpatient Rx  Name Route Sig Dispense Refill  . ALBUTEROL SULFATE HFA 108 (90 BASE) MCG/ACT IN AERS Inhalation Inhale 2 puffs into the lungs every 6 (six) hours as needed. 1 Inhaler 6  . ASPIRIN 81 MG PO TABS Oral Take 81 mg by mouth daily.      . ATORVASTATIN CALCIUM 20 MG PO TABS Oral Take 1 tablet (20 mg total) by mouth daily. 30 tablet 3  . CALCIUM GLUCONATE 500 MG PO TABS Oral Take 500 mg by mouth 2 (two) times daily.     Marland Kitchen CARVEDILOL 12.5 MG PO TABS Oral Take 1 tablet (12.5 mg total) by mouth 2 (two) times daily with a meal. 180 tablet 6  . VITAMIN D  400 UNITS PO CAPS Oral Take 400 Units by mouth daily.      Marland Kitchen CITALOPRAM HYDROBROMIDE 40 MG PO TABS Oral Take 20 mg by mouth daily.      Marland Kitchen FERROUS SULFATE 325 (65 FE) MG PO TBEC Oral Take 325 mg by mouth daily with breakfast.      . COSAMIN DS PO Oral Take 1 tablet by mouth at bedtime.      Marland Kitchen HYDROCODONE-ACETAMINOPHEN 7.5-325 MG PO TABS Oral Take 1 tablet by mouth every 6 (six) hours as needed. For pain     . METFORMIN HCL ER 500 MG PO TB24 Oral Take 1,000 mg by mouth at bedtime.      . MOMETASONE FURO-FORMOTEROL FUM 200-5 MCG/ACT IN AERO Inhalation Inhale 1 puff into the lungs as needed. 1 Inhaler 5  .  OLMESARTAN MEDOXOMIL 40 MG PO TABS Oral Take 40 mg by mouth daily.      Marland Kitchen OMEPRAZOLE 20 MG PO CPDR Oral Take 1 capsule (20 mg total) by mouth daily. 90 capsule 3  . TRAZODONE HCL 100 MG PO TABS Oral Take 1 tablet (100 mg total) by mouth at bedtime. 6 tablet 0    BP 108/61  Pulse 61  Temp(Src) 97.7 F (36.5 C) (Oral)  Resp 15  SpO2 93%  Physical Exam  Nursing note and vitals reviewed. Constitutional: She appears well-developed and well-nourished.  HENT:  Head: Normocephalic and atraumatic.       Bilateral tympanic membranes normal  Eyes: Conjunctivae are normal. Pupils are equal, round, and reactive to light.  Neck: Neck supple. No tracheal deviation present. No thyromegaly present.       No bruit  Cardiovascular: Normal rate and regular rhythm.   No murmur heard. Pulmonary/Chest: Effort normal and breath sounds normal.  Abdominal: Soft. Bowel sounds are normal. She exhibits no distension. There is no tenderness.  Musculoskeletal: Normal range of motion. She exhibits no edema and no tenderness.  Neurological: She is alert. She has normal reflexes. Coordination normal.       Gait normal Romberg normal primary drift normal  Skin: Skin is warm and dry. No rash noted.  Psychiatric: She has a normal mood and affect.    ED Course  Procedures (including critical care time)  Labs Reviewed - No data to display No results found.   No diagnosis found. Results for orders placed during the hospital encounter of 10/07/11  POCT I-STAT, CHEM 8      Component Value Range   Sodium 142  135 - 145 (mEq/L)   Potassium 4.9  3.5 - 5.1 (mEq/L)   Chloride 107  96 - 112 (mEq/L)   BUN 31 (*) 6 - 23 (mg/dL)   Creatinine, Ser 1.61 (*) 0.50 - 1.10 (mg/dL)   Glucose, Bld 096 (*) 70 - 99 (mg/dL)   Calcium, Ion 0.45  4.09 - 1.32 (mmol/L)   TCO2 30  0 - 100 (mmol/L)   Hemoglobin 13.3  12.0 - 15.0 (g/dL)   HCT 81.1  91.4 - 78.2 (%)   Mm Digital Screening  09/11/2011  DG SCREEN MAMMOGRAM BILATERAL  Bilateral CC and MLO view(s) were taken. Technologist: Antonieta Pert  DIGITAL SCREENING MAMMOGRAM WITH CAD: There are scattered fibroglandular densities.  No masses or malignant type calcifications are  identified.  Compared with prior studies.  Images were processed with CAD.  IMPRESSION: No specific mammographic evidence of malignancy.  Next screening mammogram is recommended in one  year.  A result letter of this screening  mammogram will be mailed directly to the patient.  ASSESSMENT: Negative - BI-RADS 1  Screening mammogram in 1 year. ,      MDM  Symptoms felt to be consistent with occipital neuralgia Neurology Dr. Thad Ranger evaluate patient in the emergency department Recommend prescription for Tegretol 100 mg twice a day, we will write one month supply Patient to followup with Dr. Amador Cunas Diagnoses occiptal neuralgia       Doug Sou, MD 10/07/11 [phone]

## 2011-10-07 NOTE — ED Notes (Signed)
Neurologist at bedside. 

## 2011-10-07 NOTE — ED Notes (Signed)
MD Reynolds at bedside. 

## 2011-10-07 NOTE — Consult Note (Addendum)
[facility] CONSULT NOTE     Reason for Consult: neck pain ZO:XWRU pain   HPI:    Leslie [name] is an 78 y.o. female  Who noted three days ago she had a piercing/stabbing pain in the right posterior region of her head.  This occurred a second time the next day.  Today patient awoke from sleep noting electrical like pains that started in the right posterior aspect of her head and radiated up the back of her head toward her right temporal region. The pains occurred avery 5-10 minutes.  She denies any trauma or injury to her head or neck.  She denies any symptoms of this quality prior to this visit. Currently she is in the ED and comfortable with the exception of intermittent pains as described above.  The pain lasts only seconds and are occuring on a frequency of every 3-5 minutes.   Past Medical History  Diagnosis Date  . ALLERGIC RHINITIS, SEASONAL 05/06/2007  . ANEMIA NEC 05/20/2007  . ARTERIOVENOUS MALFORMATION 09/20/2008  . BACK PAIN, LUMBAR 09/21/2008  . CARDIOMYOPATHY, SECONDARY 11/21/2008  . DEGENERATIVE JOINT DISEASE, CERVICAL SPINE 05/06/2007  . DEPRESSION 10/31/2009  . DIABETES MELLITUS, TYPE II 05/06/2007  . DIVERTICULOSIS OF COLON 09/20/2008  . G I BLEED 10/14/2007  . GERD 05/06/2007  . HIATAL HERNIA 09/20/2008  . HYPERLIPIDEMIA 05/06/2007  . HYPERTENSION 05/06/2007  . MYOCARDIAL INFARCTION, HX OF 05/06/2007  . NEPHROLITHIASIS, HX OF 07/14/2007  . OSTEOARTHRITIS 05/06/2007  . PHLEBITIS&THROMBOPHLEB SUP VEINS UPPER EXTREM 07/25/2009  . TOBACCO USE, QUIT 07/20/2009  . Blood type O+     Past Surgical History  Procedure Date  . Abdominal hysterectomy   . Appendectomy     Family History  Problem Relation Age of Onset  . Heart disease Mother   . Heart failure Mother   . Heart attack Father   . Heart attack Brother     Social History:  reports that she quit smoking about 27 years ago. Her smoking use included Cigarettes. She has a 30 pack-year  smoking history. She has never used smokeless tobacco. She reports that she drinks alcohol. Her drug history not on file.  Allergies  Allergen Reactions  . Ivp Dye (Iodinated Diagnostic Agents) Anaphylaxis  . Buprenorphine Hcl   . Macrolides And Ketolides   . Penicillins Hives  . Streptomycin   . Sulfonamide Derivatives Hives    Medications:    Prior to Admission:  Prior to Admission medications   Medication Sig Start Date End Date Taking? Authorizing Provider  albuterol (PROAIR HFA) 108 (90 BASE) MCG/ACT inhaler Inhale 2 puffs into the lungs every 6 (six) hours as needed. 09/06/11  Yes Rogelia Boga  aspirin 81 MG tablet Take 81 mg by mouth daily.     Yes Historical Provider, MD  atorvastatin (LIPITOR) 20 MG tablet Take 1 tablet (20 mg total) by mouth daily. 09/06/11 09/05/12 Yes Peter Charissa Bash  calcium gluconate 500 MG tablet Take 500 mg by mouth 2 (two) times daily.    Yes Historical Provider, MD  carvedilol (COREG) 12.5 MG tablet Take 1 tablet (12.5 mg total) by mouth 2 (two) times daily with a meal. 09/06/11  Yes Rogelia Boga  Cholecalciferol (VITAMIN D) 400 UNITS capsule Take 400 Units by mouth daily.     Yes Historical Provider, MD  citalopram (CELEXA) 40 MG tablet Take  20 mg by mouth daily.   09/06/11  Yes Rogelia Boga  ferrous sulfate 325 (65 FE) MG EC tablet Take 325 mg by mouth daily with breakfast.     Yes Historical Provider, MD  Glucosamine-Chondroitin (COSAMIN DS PO) Take 1 tablet by mouth at bedtime.     Yes Historical Provider, MD  HYDROcodone-acetaminophen (NORCO) 7.5-325 MG per tablet Take 1 tablet by mouth every 6 (six) hours as needed. For pain  09/06/11  Yes Rogelia Boga  metFORMIN (GLUCOPHAGE-XR) 500 MG 24 hr tablet Take 1,000 mg by mouth at bedtime.   09/06/11  Yes Peter Homero Fellers Kwiatkowski  Mometasone Furo-Formoterol Fum (DULERA) 200-5 MCG/ACT AERO Inhale 1 puff into the lungs as needed. 09/06/11  Yes Peter Charissa Bash  olmesartan (BENICAR) 40 MG tablet Take 40 mg by mouth daily.     Yes Historical Provider, MD  omeprazole (PRILOSEC) 20 MG capsule Take 1 capsule (20 mg total) by mouth daily. 09/06/11  Yes Peter Charissa Bash  traZODone (DESYREL) 100 MG tablet Take 1 tablet (100 mg total) by mouth at bedtime. 09/06/11  Yes Rogelia Boga     Review of Systems:  General ROS: negative for - chills, fatigue, fever or hot flashes Hematological and Lymphatic ROS: negative for - bruising, fatigue, jaundice or pallor Endocrine ROS: negative for - hair pattern changes, hot flashes, mood swings or skin changes Respiratory ROS: negative for - cough, hemoptysis, orthopnea or wheezing Cardiovascular ROS: negative for - dyspnea on exertion, orthopnea, palpitations or shortness of breath Gastrointestinal ROS: negative for - abdominal pain, appetite loss, blood in stools, diarrhea or hematemesis Musculoskeletal ROS: negative for - joint pain, joint stiffness, joint swelling or muscle pain Neurological ROS: See HPI Dermatological ROS: negative for dry skin, pruritus and rash   Physical Examination: Blood pressure 108/61, pulse 61, temperature 97.7 F (36.5 C), temperature source Oral, resp. rate 15, SpO2 93.00%. HEENT: Pain on palpation of the occipital ridge on the right and in a pinpoint area in the parietal region on the right and behind the right ear.    Neurologic Examination:   Mental Status: Alert, oriented, thought content appropriate.  Speech fluent without evidence of aphasia. Able to follow 3 step commands without difficulty. Cranial Nerves: II-Visual fields grossly intact. III/IV/VI-Extraocular movements intact.  Pupils reactive bilaterally. V/VII-Smile symmetric VIII-grossly intact IX/X-normal gag XI-bilateral shoulder shrug XII-midline tongue extension Motor: 5/5 bilaterally with normal tone and bulk Sensory: Pinprick and light touch intact throughout, bilaterally. She is  experiencing shooting pains in right occipital area which is exacerbated by palpation over occipital prominence and just above.  Deep Tendon Reflexes: 2+ and symmetric throughout Plantars: Downgoing bilaterally Cerebellar: Normal finger-to-nose, normal rapid alternating movements and normal heel-to-shin test.  Normal gait and station.   Lab Results  Component Value Date/Time   CHOL 134 09/06/2011 10:39 AM    Assessment/Plan:  78 YO female who has been experiencing pain on the right side of the head for the past 3 days.  Right occipital neuralgia likely with some shooting character.  No evidence of rash to suggest shingles at this time.  No focal neurologic findings on examination.  Occipital ridge injection suggested and procedure explained to patient along with risks and benefits.  Patient gave verbal consent and area cleaned with alcohol.  2cc lidocaine with 2cc Kenalog were injected at the right occipitial ridge without complications.   Patient experienced some initial improvement in symptoms.   1) Tegretol 100 mg PO BID ( first  to be taken tonight ) 2) 400 mg Ibuprofen tonight for pain from injection 3) Apply heating pad to right posterior region of head when home for approximately 30 minutes 4) Follow up with primary care MD after discharge for further medication adjustments of Tegretol  Felicie Morn PA-C [facility] [phone]  10/07/2011, 12:40 PM    Patient seen and examined. I agree with the above.  Thana Farr, MD [facility] [phone]  10/07/2011  4:54 PM

## 2011-10-07 NOTE — ED Notes (Signed)
Pt c/o sharp pain from right side of neck into back of head approx every 20 seconds starting today; pt sts started less frequently last week; pt sts some blurry vision

## 2011-10-10 ENCOUNTER — Ambulatory Visit (INDEPENDENT_AMBULATORY_CARE_PROVIDER_SITE_OTHER): Payer: Medicare Other | Admitting: Internal Medicine

## 2011-10-10 ENCOUNTER — Encounter: Payer: Self-pay | Admitting: Internal Medicine

## 2011-10-10 VITALS — BP 118/68 | HR 62 | Temp 97.4°F | Ht 64.0 in | Wt 184.0 lb

## 2011-10-10 DIAGNOSIS — I10 Essential (primary) hypertension: Secondary | ICD-10-CM

## 2011-10-10 DIAGNOSIS — J449 Chronic obstructive pulmonary disease, unspecified: Secondary | ICD-10-CM

## 2011-10-10 LAB — PULMONARY FUNCTION TEST

## 2011-10-10 NOTE — Patient Instructions (Signed)
Be sure to take your  prilosec Take 30-60 min before first meal of the day.  Substitute the coreg with bystolic 10 mg one daily - if blood pressure not ok can take twice daily - see if this reduces your need for inhalers and if so Dr Shirlee Latch can advise you on long term treatment options.  Only use your albuterol as a rescue medication to be used if you can't catch your breath by resting or doing a relaxed purse lip breathing pattern. The less you use it, the better it will work when you need it.  If you still feel you need inhalers even off coreg we need to see you back here to regoup.

## 2011-10-10 NOTE — Progress Notes (Signed)
PFT done today. 

## 2011-10-10 NOTE — Assessment & Plan Note (Signed)
Strongly prefer in this setting: Bystolic, the most beta -1  selective Beta blocker available in sample form, with bisoprolol the most selective generic choice  on the market.  

## 2011-10-10 NOTE — Assessment & Plan Note (Addendum)
-   PFT's  10/10/2011  FEV1 1.46 (78%) ratio 68 and no better p B2, DLCO 60 corrects to 110.   - HFA 75% p coaching 10/10/2011   She has GOLD I/II copd at most with Symptoms markedly disproportionate to objective findings and not clear this is even a lung problem but pt does appear to have difficult airway management issues.   DDX of  difficult airways managment all start with A and  include Adherence, Ace Inhibitors, Acid Reflux, Active Sinus Disease, Alpha 1 Antitripsin deficiency, Anxiety masquerading as Airways dz,  ABPA,  allergy(esp in young), Aspiration (esp in elderly), Adverse effects of DPI,  Active smokers, plus two Bs  = Bronchiectasis and Beta blocker use..and one C= CHF   Adherence is always the initial "prime suspect" and is a multilayered concern that requires a "trust but verify" approach in every patient - starting with knowing how to use medications, especially inhalers, correctly, keeping up with refills and understanding the fundamental difference between maintenance and prns vs those medications only taken for a very short course and then stopped and not refilled. The proper method of use, as well as anticipated side effects, of this metered-dose inhaler are discussed and demonstrated to the patient. Improved to 75% with coaching.  ? Acid reflux > reviewed approp rx with ppi ac daily  ? Beta blocker effect > Strongly prefer in this setting: Bystolic, the most beta -1  selective Beta blocker available in sample form, with bisoprolol the most selective generic choice  on the market.

## 2011-10-10 NOTE — Progress Notes (Signed)
  Subjective:    Patient ID: Leslie [name], female    DOB: [birth year]    MRN: [id number]   HPI  81 yowf quit smoking around 1980 due to sob but much better p  quit even with colds then started up with audible wheezing onset Oct 2012 > referred to pulmonary clinic by Drs Shirlee Latch and [facility].   08/01/2011 Initial pulmonary office eval cc new onset wheezing and mostly dry coughing x 2weeks abrupt onset seems to be improving on its own although took inhalers (proaire and dulera)  for a few days but stopped x 10 days before the ov and no significant flare off inhalers and on coreg.  Presently  Still mild doe and also overt reflux symptoms despite prilosec dosed ac q am while on fish oil and vit e in oil base.  No h/o  Allergies, asthma or sinus complaints, no purulent sputum rec gerd diet/ pfts   10/10/2011 f/u ov/Leslie [name] cc doe with walking at store or doing housework, feels need albuterol at least once during the day with minimal assoc cough but it does help her breathing. Takes prilosec daily but not ac. Still on coreg.    Sleeping ok without nocturnal  or early am exacerbation  of respiratory  c/o's or need for noct saba. Also denies any obvious fluctuation of symptoms with weather or environmental changes or other aggravating or alleviating factors except as outlined above   ROS  At present neg for  any significant sore throat, dysphagia, itching, sneezing,  nasal congestion or excess/ purulent secretions,  fever, chills, sweats, unintended wt loss, pleuritic or exertional cp, hempoptysis, orthopnea pnd or leg swelling.  Also denies presyncope, palpitations, heartburn, abdominal pain, nausea, vomiting, diarrhea  or change in bowel or urinary habits, dysuria,hematuria,  rash, arthralgias, visual complaints, headache, numbness weakness or ataxia.            Objective:   Physical Exam  Wt 179 08/01/11 > 10/10/2011  184   amb wf nad  HEENT mild turbinate edema.  Oropharynx no thrush or  excess pnd or cobblestoning.  No JVD or cervical adenopathy. Mild accessory muscle hypertrophy. Trachea midline, nl thryroid. Chest was hyperinflated by percussion with diminished breath sounds and min increased exp time without wheeze. Hoover sign positive at end inspiration. Regular rate and rhythm without murmur gallop or rub or increase P2 or edema.  Abd: no hsm, nl excursion. Ext warm without cyanosis or clubbing.    cxr 07/19/11 Findings: Trachea is midline. Heart size stable. Scarring in the  lingula and right middle lobe. Lungs are otherwise clear. No  pleural fluid. Lower thoracic vertebroplasty is noted.  IMPRESSION:  No acute findings      Assessment & Plan:

## 2011-10-11 ENCOUNTER — Telehealth: Payer: Self-pay | Admitting: Internal Medicine

## 2011-10-11 NOTE — Telephone Encounter (Signed)
Per notes from appt w/ MW yesterday, pt is to stop Coreg & start bystolic.  Advised pt & she stated nothing further is needed.  Leslie [name]

## 2011-10-14 ENCOUNTER — Telehealth: Payer: Self-pay | Admitting: *Deleted

## 2011-10-14 ENCOUNTER — Ambulatory Visit (INDEPENDENT_AMBULATORY_CARE_PROVIDER_SITE_OTHER): Payer: Medicare Other | Admitting: Internal Medicine

## 2011-10-14 ENCOUNTER — Encounter: Payer: Self-pay | Admitting: Internal Medicine

## 2011-10-14 DIAGNOSIS — I252 Old myocardial infarction: Secondary | ICD-10-CM

## 2011-10-14 DIAGNOSIS — M5481 Occipital neuralgia: Secondary | ICD-10-CM

## 2011-10-14 DIAGNOSIS — I10 Essential (primary) hypertension: Secondary | ICD-10-CM

## 2011-10-14 DIAGNOSIS — M531 Cervicobrachial syndrome: Secondary | ICD-10-CM

## 2011-10-14 DIAGNOSIS — E119 Type 2 diabetes mellitus without complications: Secondary | ICD-10-CM

## 2011-10-14 NOTE — Telephone Encounter (Signed)
Zena Amos A ','<<< Less Detail       Marca Ancona, MD         Sent:  Wynelle Link October 13, 2011 10:55 PM   To:  Jacqlyn Krauss, RN   Cc:  Sandrea Hughs, MD                 Message     Would stop Coreg and have her start on bisoprolol 10 mg daily instead (because of COPD).     10/14/11

## 2011-10-14 NOTE — Progress Notes (Signed)
  Subjective:    Patient ID: Leslie [name], female    DOB: 1932-12-04, 78 y.o.   MRN: [id number]  HPI  78 year old patient who has multiple medical problems including COPD coronary artery disease and hypertension. She was seen in the ED about one week ago and diagnosed with occipital neuralgia. She presented with significant frequent fleeting right posterior regular pain. She was placed on Tegretol 100 mg twice a day and has been essentially pain-free for the past 2 days. She is scheduled for followup next month. She was seen by pulmonary medicine and has done much better on Bystolic- Coreg discontinued    Review of Systems  Respiratory: Positive for wheezing (improved).   Neurological: Positive for headaches (improved).       Objective:   Physical Exam  Constitutional: She appears well-nourished. No distress.       Blood pressure well controlled  HENT:  Head: Normocephalic and atraumatic.  Right Ear: External ear normal.  Left Ear: External ear normal.  Mouth/Throat: Oropharynx is clear and moist.  Neck: Normal range of motion.  Cardiovascular: Regular rhythm.   Pulmonary/Chest: Effort normal and breath sounds normal. No respiratory distress. She has no wheezes.          Assessment & Plan:   Occipital neuralgia. Much response to Tegretol. Will continue on Tegretol at least until next month if she continues to do well we'll consider a rapid taper and discontinuation COPD stable Hypertension stable

## 2011-10-14 NOTE — Telephone Encounter (Signed)
According to telephone note 10/11/11 pt was started on Bystolic in the place of Coreg by Dr Sherene Sires.

## 2011-10-14 NOTE — Patient Instructions (Signed)
Limit your sodium (Salt) intake  Call or return to clinic prn if these symptoms worsen or fail to improve as anticipated.   

## 2011-10-21 ENCOUNTER — Other Ambulatory Visit: Payer: Self-pay

## 2011-10-21 MED ORDER — OLMESARTAN MEDOXOMIL-HCTZ 40-12.5 MG PO TABS: 1.0000 | ORAL_TABLET | Freq: Every day | ORAL | Status: DC

## 2011-10-23 ENCOUNTER — Other Ambulatory Visit: Payer: Self-pay

## 2011-10-23 MED ORDER — TRAZODONE HCL 100 MG PO TABS: 100.0000 mg | ORAL_TABLET | Freq: Every day | ORAL | Status: DC

## 2011-10-23 NOTE — Telephone Encounter (Signed)
Rx request for Trazodone 100 mg #90.  Rx last filled 09/06/11 #6x 0 rf.  Pt last seen 10/14/11.  Pls advise.

## 2011-10-23 NOTE — Telephone Encounter (Signed)
ok 

## 2011-10-23 NOTE — Telephone Encounter (Signed)
Rx called in to pharmacy. 

## 2011-11-08 IMAGING — CR DG CHEST 2V
2 series · 2 of 2 positions shown · non-contrast
Comparison: 09/29/2008.

CLINICAL DATA: Wheezing.  Cough, shortness of breath and chest
pain.

CHEST - 2 VIEW

[view not recorded (1 of 2)]
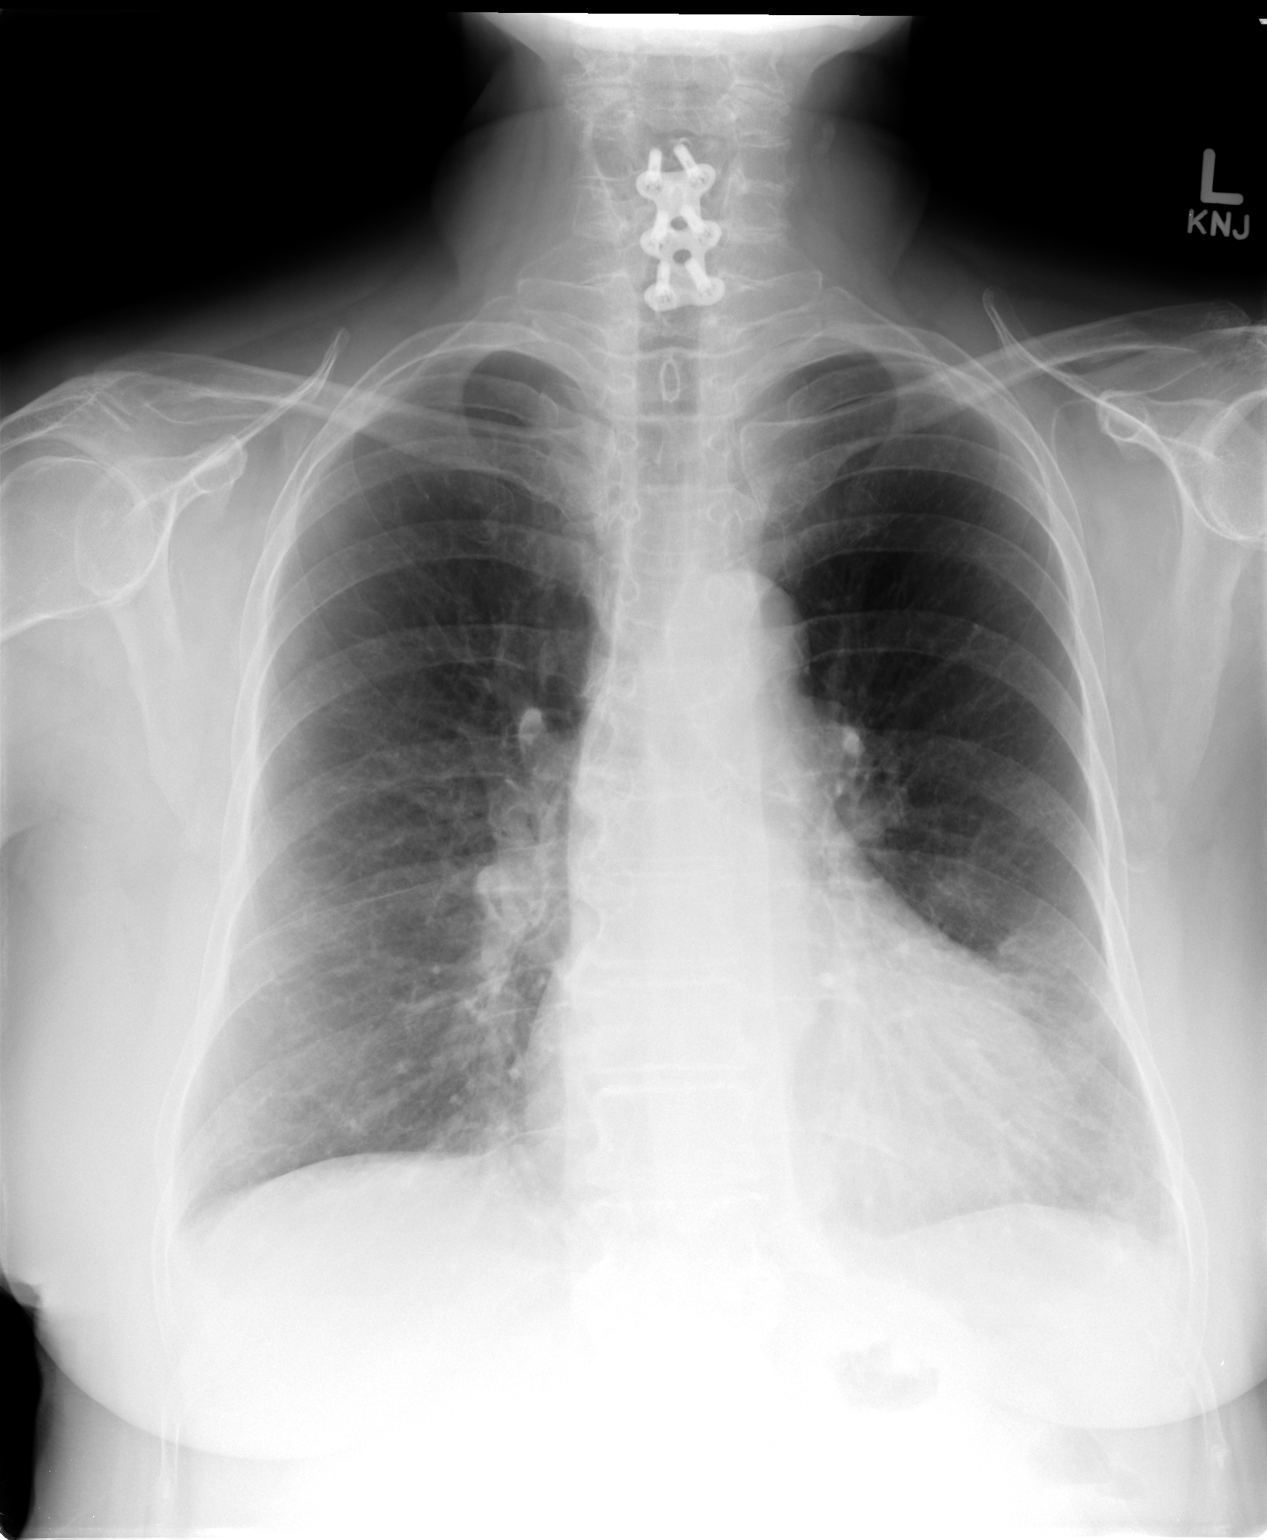

[view not recorded (2 of 2)]
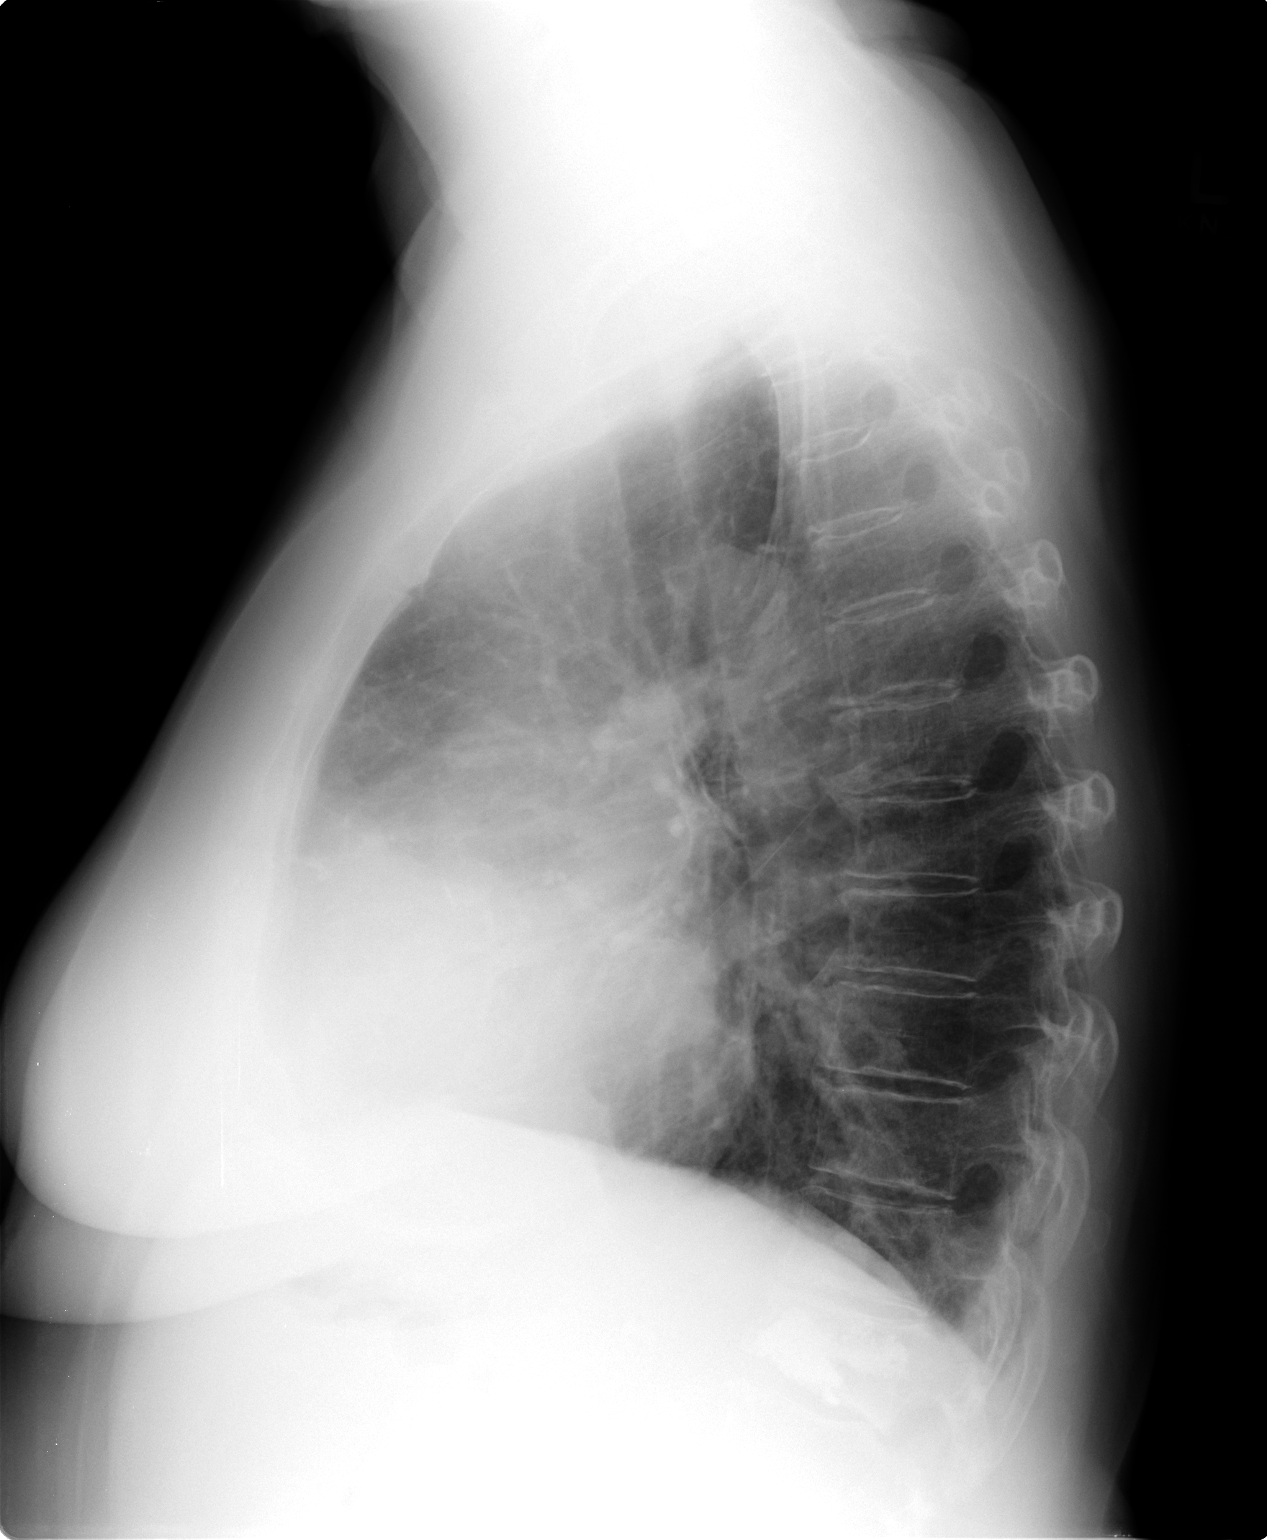

[2 of 2 positions shown; findings below may reference images not displayed]

FINDINGS: Trachea is midline.  Heart size stable.  Scarring in the
lingula and right middle lobe.  Lungs are otherwise clear.  No
pleural fluid.  Lower thoracic vertebroplasty is noted.
IMPRESSION: No acute findings.

## 2011-11-18 ENCOUNTER — Other Ambulatory Visit: Payer: Self-pay | Admitting: Internal Medicine

## 2011-11-18 IMAGING — CR DG ABDOMEN 1V
1 series · 1 of 1 positions shown · non-contrast
Comparison: 07/19/2011

CLINICAL DATA: Right nephrolithiasis

ABDOMEN - 1 VIEW

[t abdomen supine]
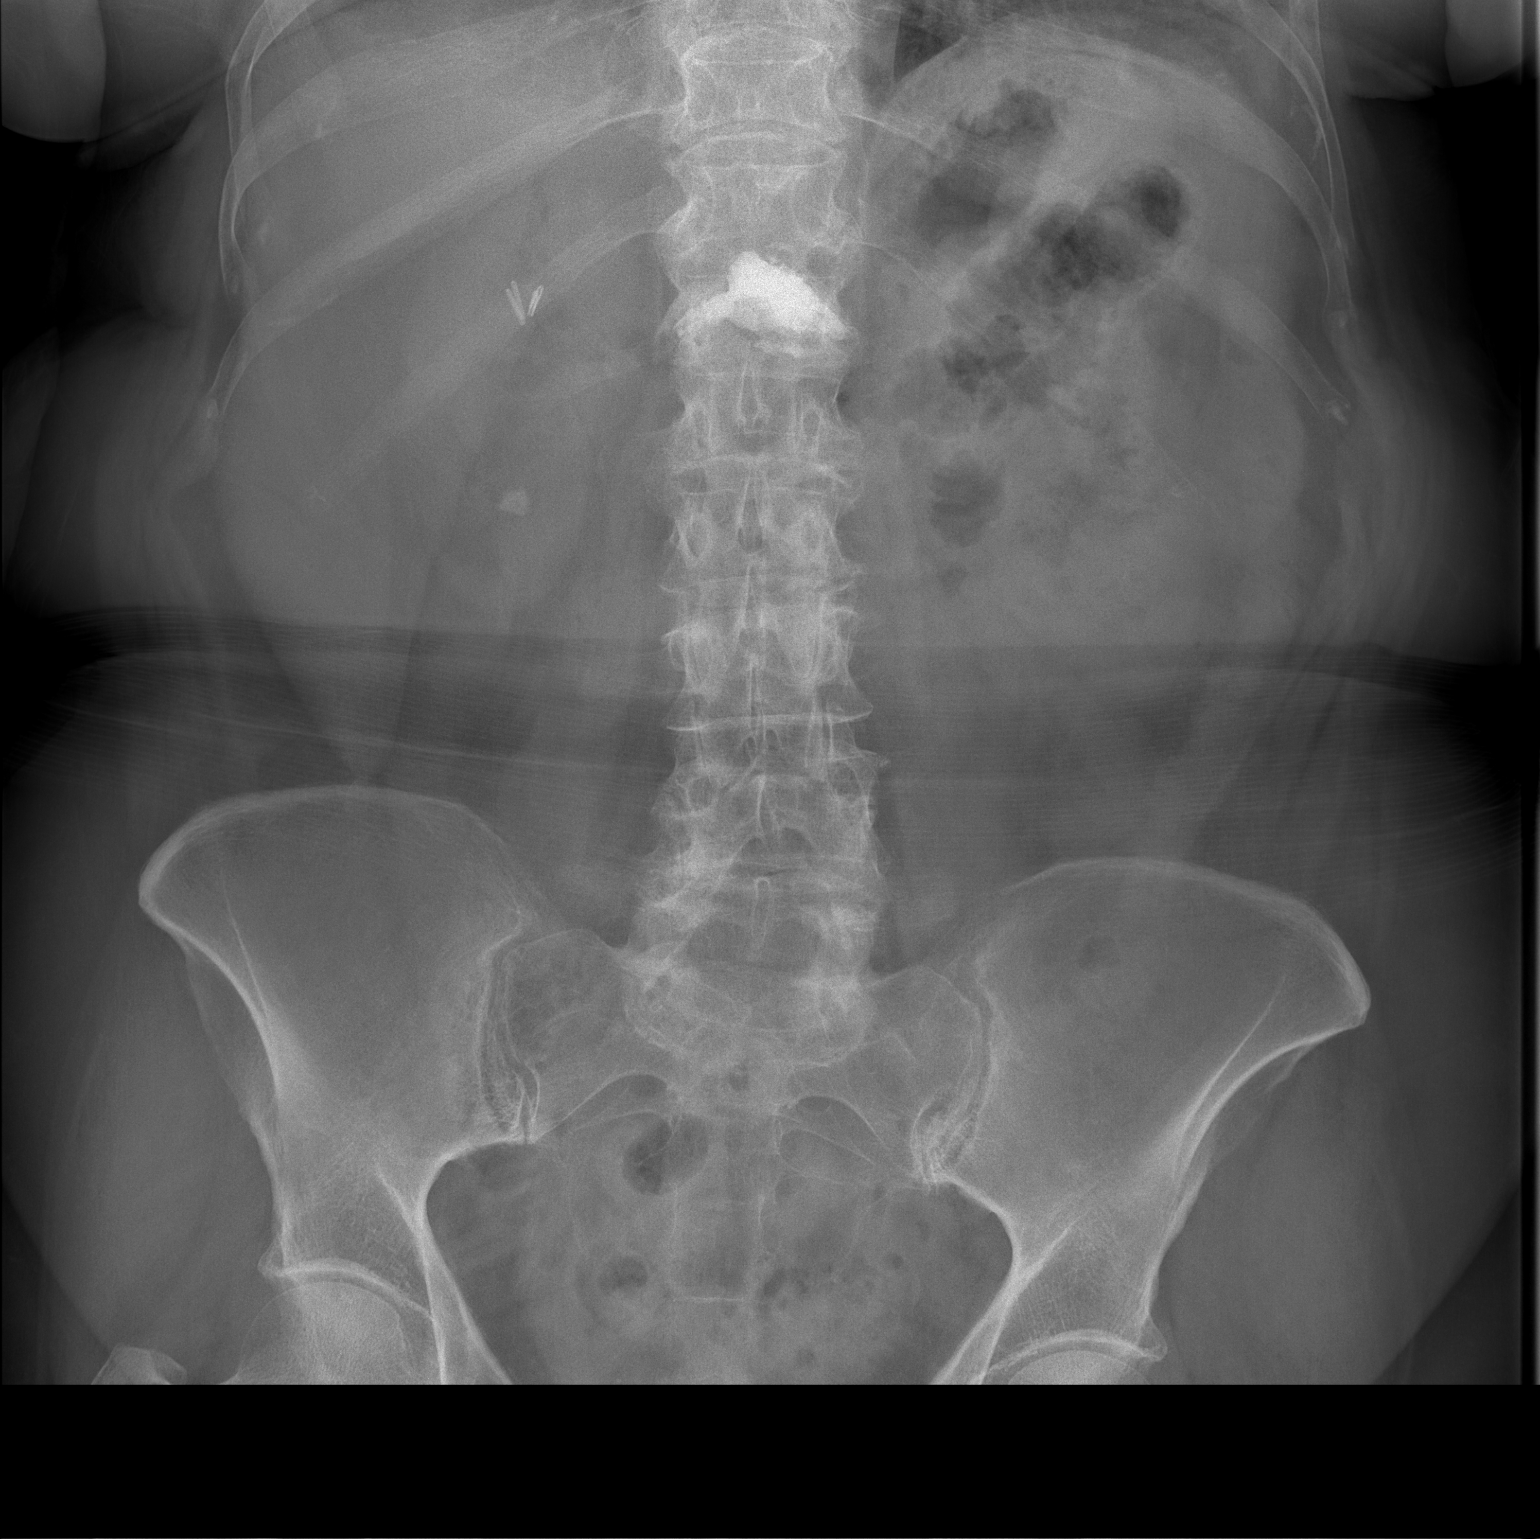

[1 of 1 positions shown; findings below may reference images not displayed]

FINDINGS: Radiopaque right lower pole renal calculus measures
cm in diameter. This correlates with a previous right UPJ stone
demonstrated 07/19/2011.  Previous cholecystectomy and T12
vertebral augmentation present.  Nonobstructive bowel gas pattern
evident.  The bones appear osteopenic.
IMPRESSION: Right nephrolithiasis within the lower pole.

## 2011-11-19 ENCOUNTER — Telehealth: Payer: Self-pay | Admitting: Internal Medicine

## 2011-11-19 NOTE — Telephone Encounter (Signed)
Spoke with pt- sig on metformin clarification- [facility] - should be  2 po qhs as it has always been -  [facility] called [facility] and spoke with pharmacist - clarified and they now have correct rx - that was efiled yesterday

## 2011-11-19 NOTE — Telephone Encounter (Signed)
Pt has questions concerning metformin and tegretol requesting kim to return her call today

## 2011-11-27 ENCOUNTER — Other Ambulatory Visit: Payer: Self-pay | Admitting: Internal Medicine

## 2011-11-27 NOTE — Telephone Encounter (Signed)
Pt need citalopram 40mg  once a day call [facility] [address] [phone]

## 2011-11-28 NOTE — Telephone Encounter (Signed)
This was done yesterday.  

## 2011-12-05 ENCOUNTER — Encounter: Payer: Self-pay | Admitting: Internal Medicine

## 2011-12-05 ENCOUNTER — Ambulatory Visit (INDEPENDENT_AMBULATORY_CARE_PROVIDER_SITE_OTHER): Payer: Medicare Other | Admitting: Internal Medicine

## 2011-12-05 DIAGNOSIS — I10 Essential (primary) hypertension: Secondary | ICD-10-CM

## 2011-12-05 DIAGNOSIS — E119 Type 2 diabetes mellitus without complications: Secondary | ICD-10-CM

## 2011-12-05 DIAGNOSIS — M1990 Unspecified osteoarthritis, unspecified site: Secondary | ICD-10-CM

## 2011-12-05 LAB — HEMOGLOBIN A1C: Hgb A1c MFr Bld: 6.1 % (ref 4.6–6.5)

## 2011-12-05 MED ORDER — NEBIVOLOL HCL 10 MG PO TABS: 10.0000 mg | ORAL_TABLET | Freq: Every day | ORAL | Status: DC

## 2011-12-05 NOTE — Progress Notes (Signed)
  Subjective:    Patient ID: Leslie [name], female    DOB: 19-Sep-1933, 78 y.o.   MRN: [id number]  HPI  78 year old patient who is seen today for followup. She has type 2 diabetes which has not well controlled on metformin therapy. She is doing quite well today. She has a history of hypertension and also coronary artery disease. She is doing quite well. Her only complaint is right posterior neck pain. She does have a history of osteoarthritis    Review of Systems  Constitutional: Negative.   HENT: Negative for hearing loss, congestion, sore throat, rhinorrhea, dental problem, sinus pressure and tinnitus.   Eyes: Negative for pain, discharge and visual disturbance.  Respiratory: Negative for cough and shortness of breath.   Cardiovascular: Negative for chest pain, palpitations and leg swelling.  Gastrointestinal: Negative for nausea, vomiting, abdominal pain, diarrhea, constipation, blood in stool and abdominal distention.  Genitourinary: Negative for dysuria, urgency, frequency, hematuria, flank pain, vaginal bleeding, vaginal discharge, difficulty urinating, vaginal pain and pelvic pain.  Musculoskeletal: Negative for joint swelling, arthralgias and gait problem.       Neck pain with movement  Skin: Negative for rash.  Neurological: Negative for dizziness, syncope, speech difficulty, weakness, numbness and headaches.  Hematological: Negative for adenopathy.  Psychiatric/Behavioral: Negative for behavioral problems, dysphoric mood and agitation. The patient is not nervous/anxious.        Objective:   Physical Exam  Constitutional: She is oriented to person, place, and time. She appears well-developed and well-nourished.  HENT:  Head: Normocephalic.  Right Ear: External ear normal.  Left Ear: External ear normal.  Mouth/Throat: Oropharynx is clear and moist.  Eyes: Conjunctivae and EOM are normal. Pupils are equal, round, and reactive to light.  Neck: Normal range of motion. Neck  supple. No thyromegaly present.  Cardiovascular: Normal rate, regular rhythm, normal heart sounds and intact distal pulses.   Pulmonary/Chest: Effort normal and breath sounds normal.  Abdominal: Soft. Bowel sounds are normal. She exhibits no mass. There is no tenderness.  Musculoskeletal: Normal range of motion.  Lymphadenopathy:    She has no cervical adenopathy.  Neurological: She is alert and oriented to person, place, and time.  Skin: Skin is warm and dry. No rash noted.  Psychiatric: She has a normal mood and affect. Her behavior is normal.          Assessment & Plan:   Diabetes mellitus. Well controlled Hypertension stable Osteoarthritis.  We'll checking only one sitting. Exercise encouraged. Low salt diet encouraged recheck 3 months

## 2011-12-05 NOTE — Patient Instructions (Signed)
Limit your sodium (Salt) intake    It is important that you exercise regularly, at least 20 minutes 3 to 4 times per week.  If you develop chest pain or shortness of breath seek  medical attention.   Please check your hemoglobin A1c every 3 months   

## 2012-01-13 ENCOUNTER — Other Ambulatory Visit: Payer: Self-pay | Admitting: Internal Medicine

## 2012-02-14 ENCOUNTER — Other Ambulatory Visit: Payer: Self-pay | Admitting: Cardiology

## 2012-03-05 ENCOUNTER — Ambulatory Visit (INDEPENDENT_AMBULATORY_CARE_PROVIDER_SITE_OTHER): Payer: Medicare Other | Admitting: Internal Medicine

## 2012-03-05 ENCOUNTER — Encounter: Payer: Self-pay | Admitting: Internal Medicine

## 2012-03-05 VITALS — BP 120/80 | Temp 98.1°F | Wt 187.0 lb

## 2012-03-05 DIAGNOSIS — K219 Gastro-esophageal reflux disease without esophagitis: Secondary | ICD-10-CM

## 2012-03-05 DIAGNOSIS — I10 Essential (primary) hypertension: Secondary | ICD-10-CM

## 2012-03-05 DIAGNOSIS — E119 Type 2 diabetes mellitus without complications: Secondary | ICD-10-CM

## 2012-03-05 DIAGNOSIS — E785 Hyperlipidemia, unspecified: Secondary | ICD-10-CM

## 2012-03-05 DIAGNOSIS — R0989 Other specified symptoms and signs involving the circulatory and respiratory systems: Secondary | ICD-10-CM

## 2012-03-05 DIAGNOSIS — M1990 Unspecified osteoarthritis, unspecified site: Secondary | ICD-10-CM

## 2012-03-05 LAB — HEMOGLOBIN A1C: Hgb A1c MFr Bld: 6.4 % (ref 4.6–6.5)

## 2012-03-05 NOTE — Progress Notes (Signed)
Subjective:    Patient ID: Leslie [name], female    DOB: 1933/09/17, 78 y.o.   MRN: [id number]  HPI  78 year old patient who is seen today for her quarterly followup. She has a history of type 2 diabetes which has been controlled very nicely on metformin therapy alone. She has hypertension and coronary artery disease. She has done quite well and denies any exertional chest pain. She has dyslipidemia and a history of gastroesophageal reflux disease which remains on Prilosec and does quite well. Her depression has been stable. No new concerns or complaints today.  Past Medical History  Diagnosis Date  . ALLERGIC RHINITIS, SEASONAL 05/06/2007  . ANEMIA NEC 05/20/2007  . ARTERIOVENOUS MALFORMATION 09/20/2008  . BACK PAIN, LUMBAR 09/21/2008  . CARDIOMYOPATHY, SECONDARY 11/21/2008  . DEGENERATIVE JOINT DISEASE, CERVICAL SPINE 05/06/2007  . DEPRESSION 10/31/2009  . DIABETES MELLITUS, TYPE II 05/06/2007  . DIVERTICULOSIS OF COLON 09/20/2008  . G I BLEED 10/14/2007  . GERD 05/06/2007  . HIATAL HERNIA 09/20/2008  . HYPERLIPIDEMIA 05/06/2007  . HYPERTENSION 05/06/2007  . MYOCARDIAL INFARCTION, HX OF 05/06/2007  . NEPHROLITHIASIS, HX OF 07/14/2007  . OSTEOARTHRITIS 05/06/2007  . PHLEBITIS&THROMBOPHLEB SUP VEINS UPPER EXTREM 07/25/2009  . TOBACCO USE, QUIT 07/20/2009  . Blood type O+     History   Social History  . Marital Status: Widowed    Spouse Name: N/A    Number of Children: N/A  . Years of Education: N/A   Occupational History  . Not on file.   Social History Main Topics  . Smoking status: Former Smoker -- 1.0 packs/day for 30 years    Types: Cigarettes    Quit date: 07/15/1984  . Smokeless tobacco: Never Used  . Alcohol Use: Yes     2-3 glasses of wine  . Drug Use: Not on file  . Sexually Active: Not on file   Other Topics Concern  . Not on file   Social History Narrative  . No narrative on file    Past Surgical History  Procedure Date  . Abdominal hysterectomy   . Appendectomy       Family History  Problem Relation Age of Onset  . Heart disease Mother   . Heart failure Mother   . Heart attack Father   . Heart attack Brother     Allergies  Allergen Reactions  . Ivp Dye (Iodinated Diagnostic Agents) Anaphylaxis  . Buprenorphine Hcl   . Macrolides And Ketolides   . Penicillins Hives  . Streptomycin   . Sulfonamide Derivatives Hives    Current Outpatient Prescriptions on File Prior to Visit  Medication Sig Dispense Refill  . albuterol (PROAIR HFA) 108 (90 BASE) MCG/ACT inhaler Inhale 2 puffs into the lungs every 6 (six) hours as needed.  1 Inhaler  6  . amLODipine (NORVASC) 5 MG tablet Take 5 mg by mouth daily.      Marland Kitchen aspirin 81 MG tablet Take 81 mg by mouth daily.        Marland Kitchen atorvastatin (LIPITOR) 20 MG tablet TAKE 1 TABLET (20 MG TOTAL) BY MOUTH DAILY.  30 tablet  11  . calcium gluconate 500 MG tablet Take 500 mg by mouth 2 (two) times daily.       . carbamazepine (TEGRETOL XR) 100 MG 12 hr tablet Take 1 tablet (100 mg total) by mouth 2 (two) times daily.  60 tablet  1  . Cholecalciferol (VITAMIN D) 400 UNITS capsule Take 400 Units by mouth daily.        Marland Kitchen  citalopram (CELEXA) 40 MG tablet Take 20 mg by mouth daily.        . citalopram (CELEXA) 40 MG tablet TAKE 1 TABLET BY MOUTH EVERY DAY  90 tablet  3  . ferrous sulfate 325 (65 FE) MG EC tablet Take 325 mg by mouth daily with breakfast.        . Glucosamine-Chondroitin (COSAMIN DS PO) Take 1 tablet by mouth at bedtime.        Marland Kitchen HYDROcodone-acetaminophen (NORCO) 7.5-325 MG per tablet Take 1 tablet by mouth every 6 (six) hours as needed. For pain       . ibuprofen (ADVIL,MOTRIN) 200 MG tablet Take 200 mg by mouth every 6 (six) hours as needed.      . metFORMIN (GLUCOPHAGE-XR) 500 MG 24 hr tablet TAKE 2 TABLETS BY MOUTH AT BEDTIME  180 tablet  3  . Mometasone Furo-Formoterol Fum (DULERA) 200-5 MCG/ACT AERO Inhale 1 puff into the lungs as needed.  1 Inhaler  5  . Multiple Vitamins-Minerals (CENTRUM ULTRA  WOMENS) TABS Take 1 tablet by mouth daily.      . nebivolol (BYSTOLIC) 10 MG tablet Take 1 tablet (10 mg total) by mouth daily.  90 tablet  6  . olmesartan-hydrochlorothiazide (BENICAR HCT) 40-12.5 MG per tablet Take 1 tablet by mouth daily.  90 tablet  2  . omeprazole (PRILOSEC) 20 MG capsule TAKE 1 CAPSULE EVERY DAY  90 capsule  3  . traZODone (DESYREL) 100 MG tablet Take 1 tablet (100 mg total) by mouth at bedtime.  90 tablet  0    BP 120/80  Temp(Src) 98.1 F (36.7 C) (Oral)  Wt 187 lb (84.823 kg)       Review of Systems  Constitutional: Negative.   HENT: Negative for hearing loss, congestion, sore throat, rhinorrhea, dental problem, sinus pressure and tinnitus.   Eyes: Negative for pain, discharge and visual disturbance.  Respiratory: Negative for cough and shortness of breath.   Cardiovascular: Negative for chest pain, palpitations and leg swelling.  Gastrointestinal: Negative for nausea, vomiting, abdominal pain, diarrhea, constipation, blood in stool and abdominal distention.  Genitourinary: Negative for dysuria, urgency, frequency, hematuria, flank pain, vaginal bleeding, vaginal discharge, difficulty urinating, vaginal pain and pelvic pain.  Musculoskeletal: Positive for back pain and arthralgias. Negative for joint swelling and gait problem.  Skin: Negative for rash.  Neurological: Negative for dizziness, syncope, speech difficulty, weakness, numbness and headaches.  Hematological: Negative for adenopathy.  Psychiatric/Behavioral: Negative for behavioral problems, dysphoric mood and agitation. The patient is not nervous/anxious.        Objective:   Physical Exam  Constitutional: She is oriented to person, place, and time. She appears well-developed and well-nourished.  HENT:  Head: Normocephalic.  Right Ear: External ear normal.  Left Ear: External ear normal.  Mouth/Throat: Oropharynx is clear and moist.  Eyes: Conjunctivae and EOM are normal. Pupils are equal,  round, and reactive to light.  Neck: Normal range of motion. Neck supple. No thyromegaly present.       Brief left-sided bruit  Cardiovascular: Normal rate, regular rhythm, normal heart sounds and intact distal pulses.   Pulmonary/Chest: Effort normal and breath sounds normal.  Abdominal: Soft. Bowel sounds are normal. She exhibits no mass. There is no tenderness.  Musculoskeletal: Normal range of motion.  Lymphadenopathy:    She has no cervical adenopathy.  Neurological: She is alert and oriented to person, place, and time.  Skin: Skin is warm and dry. No rash noted.  Psychiatric: She  has a normal mood and affect. Her behavior is normal.          Assessment & Plan:    Diabetes mellitus. We'll check a hemoglobin A1c will continue on good eating habits an active lifestyle modest weight loss encouraged Hypertension well controlled we'll continue present regimen Coronary artery disease stable GERD stable we'll continue omeprazole  Recheck 3 months

## 2012-03-05 NOTE — Patient Instructions (Signed)
Limit your sodium (Salt) intake  Please check your blood pressure on a regular basis.  If it is consistently greater than 150/90, please make an office appointment.   Please check your hemoglobin A1c every 3 months    It is important that you exercise regularly, at least 20 minutes 3 to 4 times per week.  If you develop chest pain or shortness of breath seek  medical attention.   

## 2012-03-30 ENCOUNTER — Telehealth: Payer: Self-pay | Admitting: Internal Medicine

## 2012-03-30 NOTE — Telephone Encounter (Signed)
Okay to order urine for microalbumin and bone density

## 2012-03-30 NOTE — Telephone Encounter (Signed)
Patient called stating that her insurance [employer] nurse states that her microalbumin lab was not sent to them and patient's insurance requests that she have a bone density test. Please advise.

## 2012-03-30 NOTE — Telephone Encounter (Signed)
I dont see where we would of ordered microablbumin  Please advise  Also on  Bone density to be ordered - dx?

## 2012-03-31 NOTE — Telephone Encounter (Signed)
Attempt to call- VM - LMTCB - we would need to order both test - pt needs to call if she want [place] to order - will need lab appt and bone density appt if she calls back - I will place order when she calls back.

## 2012-04-01 ENCOUNTER — Other Ambulatory Visit: Payer: Medicare Other

## 2012-04-01 ENCOUNTER — Other Ambulatory Visit: Payer: Self-pay | Admitting: Internal Medicine

## 2012-04-01 DIAGNOSIS — Z0000 Encounter for general adult medical examination without abnormal findings: Secondary | ICD-10-CM

## 2012-04-01 LAB — MICROALBUMIN / CREATININE URINE RATIO: Microalb Creat Ratio: 0.3 mg/g (ref 0.0–30.0)

## 2012-04-01 NOTE — Telephone Encounter (Signed)
Pt would like 90 day supply of amlodipine 5 mg [phone]

## 2012-04-08 ENCOUNTER — Telehealth: Payer: Self-pay | Admitting: Internal Medicine

## 2012-04-08 NOTE — Telephone Encounter (Signed)
Called from [phone] for results of recent microalbumin test.  Since test was NL and reviewed by MD, informed test was normal.

## 2012-04-09 NOTE — Telephone Encounter (Signed)
noted 

## 2012-04-20 ENCOUNTER — Other Ambulatory Visit: Payer: Self-pay | Admitting: Internal Medicine

## 2012-04-21 ENCOUNTER — Other Ambulatory Visit: Payer: Medicare Other

## 2012-04-24 ENCOUNTER — Other Ambulatory Visit: Payer: Self-pay | Admitting: Internal Medicine

## 2012-04-24 ENCOUNTER — Ambulatory Visit (INDEPENDENT_AMBULATORY_CARE_PROVIDER_SITE_OTHER)
Admission: RE | Admit: 2012-04-24 | Discharge: 2012-04-24 | Disposition: A | Discharge status: Home / Self Care | Payer: Medicare Other | Source: Ambulatory Visit

## 2012-04-24 ENCOUNTER — Encounter: Payer: Self-pay | Admitting: Cardiology

## 2012-04-24 ENCOUNTER — Ambulatory Visit (INDEPENDENT_AMBULATORY_CARE_PROVIDER_SITE_OTHER): Payer: Medicare Other | Admitting: Cardiology

## 2012-04-24 VITALS — BP 114/68 | HR 61 | Ht 64.0 in | Wt 187.0 lb

## 2012-04-24 DIAGNOSIS — R062 Wheezing: Secondary | ICD-10-CM

## 2012-04-24 DIAGNOSIS — I10 Essential (primary) hypertension: Secondary | ICD-10-CM

## 2012-04-24 DIAGNOSIS — I6529 Occlusion and stenosis of unspecified carotid artery: Secondary | ICD-10-CM

## 2012-04-24 DIAGNOSIS — M1990 Unspecified osteoarthritis, unspecified site: Secondary | ICD-10-CM

## 2012-04-24 DIAGNOSIS — R0600 Dyspnea, unspecified: Secondary | ICD-10-CM

## 2012-04-24 DIAGNOSIS — D649 Anemia, unspecified: Secondary | ICD-10-CM

## 2012-04-24 DIAGNOSIS — Z780 Asymptomatic menopausal state: Secondary | ICD-10-CM

## 2012-04-24 DIAGNOSIS — E785 Hyperlipidemia, unspecified: Secondary | ICD-10-CM

## 2012-04-24 DIAGNOSIS — I429 Cardiomyopathy, unspecified: Secondary | ICD-10-CM

## 2012-04-24 DIAGNOSIS — R0602 Shortness of breath: Secondary | ICD-10-CM

## 2012-04-24 LAB — LIPID PANEL
HDL: 50.3 mg/dL (ref >39.00)
LDL Cholesterol: 58 mg/dL (ref 0–99)
Total CHOL/HDL Ratio: 3
Triglycerides: 151 mg/dL — ABNORMAL HIGH (ref 0.0–149.0)
VLDL: 30.2 mg/dL (ref 0.0–40.0)

## 2012-04-24 NOTE — Patient Instructions (Addendum)
Your physician recommends that you have  a FASTING lipid profile /BNP today.  Your physician has requested that you have a carotid duplex. This test is an ultrasound of the carotid arteries in your neck. It looks at blood flow through these arteries that supply the brain with blood. Allow one hour for this exam. There are no restrictions or special instructions. October 2013  Your physician recommends that you schedule a follow-up appointment with Dr Sherene Sires for your wheezing.  Your physician wants you to follow-up in: 1 year with Dr Shirlee Latch. (July 2014).You will receive a reminder letter in the mail two months in advance. If you don't receive a letter, please call our office to schedule the follow-up appointment.

## 2012-04-27 DIAGNOSIS — R062 Wheezing: Secondary | ICD-10-CM | POA: Insufficient documentation

## 2012-04-27 NOTE — Assessment & Plan Note (Signed)
Takotsubo cardiomyopathy in the past with recovery of EF.   

## 2012-04-27 NOTE — Assessment & Plan Note (Signed)
Due for carotid dopplers in 10/13.

## 2012-04-27 NOTE — Assessment & Plan Note (Signed)
She continues to have audible wheezing.  It sounds upper airways to me.  ? Pseudoasthma/upper airways dysfunction.  She has seen Dr. Sherene Sires in the past and wants some guidance on what to do next.  I will have her followup with him.

## 2012-04-27 NOTE — Assessment & Plan Note (Signed)
She does not appear volume overloaded on exam.  I will check a BNP.

## 2012-04-27 NOTE — Progress Notes (Signed)
Patient ID: Leslie [name], female   DOB: [birth year], 78 y.o.   MRN: [id number] PCP: Dr. Amador Cunas  78 yo with diabetes, HTN, and hyperlipidemia as well as history of presumed Takotsubo cardiomyopathy presents for cardiology followup.  Last echo showed EF back to 50-55% in 2/12.    Dobutamine myoview was done in 10/12, showing EF 51% and no evidence for ischemia/infarction.  She has continued to have audible wheezing with stable dyspnea after walking about 2 blocks.  No chest pain.  No orthopnea/PND.  She has seen Dr. Sherene Sires.  Beta blocker was changed to Bystolic.    ECG: NSR, iRBBB, 1/2 mm anterolateral ST depression (nonspecific), mild QTc prolongation.    Labs (10/11): K 4.7, creatinine 0.8, LDL 86, HDL 49, TSH normal  Labs (3/87): K 4.4, creatinine 0.9, BNP 82 Labs (12/12): K 4.3, creatinine 1.2, TSH normal, LDL 67, HDL 54  Allergies:  1) ! Sulfa  2) ! Penicillin G Potassium (Penicillin G Potassium)  3) ! Streptomycin Sulfate (Streptomycin Sulfate)  4) ! Buprenex (Buprenorphine Hcl)  5) ! * Ivp Dye   Past Medical History:  1. Takotsubo cardiomyopathy: Sister died in 05/06/2023 and patient developed CP with ECG changes and evidence for NSTEMI. LHC showed mild nonobstructive CAD with mildly decreased LV systolic function. Echo in 2/11 showed EF 40-45% with diffuse hypokinesis. Echo (2/12) showed EF 50-55% with no significant valvular abnormalities and normal RV.  2. LHC May 06, 2023): Luminal irregularities.  3. HIATAL HERNIA (ICD-553.3) and GERD  4. DIVERTICULOSIS OF COLON (ICD-562.10): History of GI bleeding.  5. ARTERIOVENOUS MALFORMATION (ICD-747.60)  6. NEPHROLITHIASIS, HX OF (ICD-V13.01)  7. ANEMIA NEC (ICD-285.8)  8. OSTEOARTHRITIS (ICD-715.90)  9. Hyperlipidemia  10. Diabetes.  11. HTN  12. ALLERGIC RHINITIS, SEASONAL (ICD-477.0)  13. DEGENERATIVE JOINT DISEASE, CERVICAL SPINE (ICD-721.90)  14. Depression  15. Dobutamine myoview (10/12): EF 51%, no ischemia or infarction.  16. Carotid  stenosis: Carotid dopplers (10/12) with 40-59% RICA.  17. Upper airways dysfunction/pseudoasthma  Family History:  Father died at 65 of an MI  mother at 33 of an MI and had congestive heart failure  one brother died at 74 of an MI  two sisters, one status post CABG  Family History of Diabetes: Sister, Father  Family History of Breast Cancer: Sister, 2 aunts   Social History:  Regular exercise-yes  Patient is a former smoker (quit in the 97s) widowed 06-2010  Works for [employer], [employer]/film/video games    Current Outpatient Prescriptions  Medication Sig Dispense Refill  . albuterol (PROAIR HFA) 108 (90 BASE) MCG/ACT inhaler Inhale 2 puffs into the lungs every 6 (six) hours as needed.  1 Inhaler  6  . amLODipine (NORVASC) 5 MG tablet TAKE 1 TABLET (5 MG TOTAL) BY MOUTH DAILY.  30 tablet  6  . aspirin 81 MG tablet Take 81 mg by mouth daily.        Marland Kitchen atorvastatin (LIPITOR) 20 MG tablet TAKE 1 TABLET (20 MG TOTAL) BY MOUTH DAILY.  30 tablet  11  . calcium gluconate 500 MG tablet Take 500 mg by mouth 2 (two) times daily.       . Cholecalciferol (VITAMIN D) 400 UNITS capsule Take 400 Units by mouth daily.        . citalopram (CELEXA) 40 MG tablet TAKE 1 TABLET BY MOUTH EVERY DAY  90 tablet  3  . ferrous sulfate 325 (65 FE) MG EC tablet Take 325 mg by mouth daily with breakfast.        .  fish oil-omega-3 fatty acids 1000 MG capsule Take 1 g by mouth daily.      . Glucosamine-Chondroitin (COSAMIN DS PO) Take 1 tablet by mouth at bedtime.        Marland Kitchen HYDROcodone-acetaminophen (NORCO) 7.5-325 MG per tablet Take 1 tablet by mouth every 6 (six) hours as needed. For pain       . ibuprofen (ADVIL,MOTRIN) 200 MG tablet Take 200 mg by mouth every 6 (six) hours as needed.      . metFORMIN (GLUCOPHAGE-XR) 500 MG 24 hr tablet TAKE 2 TABLETS BY MOUTH AT BEDTIME  180 tablet  3  . Multiple Vitamins-Minerals (CENTRUM ULTRA WOMENS) TABS Take 1 tablet by mouth daily.      . nebivolol (BYSTOLIC) 10 MG  tablet Take 1 tablet (10 mg total) by mouth daily.  90 tablet  6  . olmesartan-hydrochlorothiazide (BENICAR HCT) 40-12.5 MG per tablet Take 1 tablet by mouth daily.  90 tablet  2  . omeprazole (PRILOSEC) 20 MG capsule TAKE 1 CAPSULE EVERY DAY  90 capsule  3  . traZODone (DESYREL) 100 MG tablet TAKE 1 TABLET BY MOUTH AT BEDTIME  90 tablet  1  . VITAMIN E PO Take by mouth.        BP 114/68  Pulse 61  Ht 5\' 4"  (1.626 m)  Wt 187 lb (84.823 kg)  BMI 32.10 kg/m2 General: NAD Neck: No JVD, no thyromegaly or thyroid nodule.  Lungs: Some wheezing, seems to be upper airways in origin CV: Nondisplaced PMI.  Heart regular S1/S2, no S3/S4, no murmur.  No peripheral edema.  No carotid bruit.  Normal pedal pulses.  Abdomen: Soft, nontender, no hepatosplenomegaly, no distention.  Neurologic: Alert and oriented x 3.  Psych: Normal affect. Extremities: No clubbing or cyanosis.

## 2012-04-27 NOTE — Assessment & Plan Note (Signed)
Goal LDL < 70 with known vascular disease.  Repeat lipids.

## 2012-04-28 ENCOUNTER — Telehealth: Payer: Self-pay | Admitting: Cardiology

## 2012-04-28 ENCOUNTER — Encounter: Payer: Self-pay | Admitting: Adult Health

## 2012-04-28 ENCOUNTER — Ambulatory Visit (INDEPENDENT_AMBULATORY_CARE_PROVIDER_SITE_OTHER): Payer: Medicare Other | Admitting: Adult Health

## 2012-04-28 VITALS — BP 114/64 | HR 68 | Temp 97.2°F | Ht 64.5 in | Wt 187.0 lb

## 2012-04-28 DIAGNOSIS — R0600 Dyspnea, unspecified: Secondary | ICD-10-CM

## 2012-04-28 DIAGNOSIS — J449 Chronic obstructive pulmonary disease, unspecified: Secondary | ICD-10-CM

## 2012-04-28 DIAGNOSIS — R0989 Other specified symptoms and signs involving the circulatory and respiratory systems: Secondary | ICD-10-CM

## 2012-04-28 MED ORDER — PREDNISONE 10 MG PO TABS: ORAL_TABLET | ORAL | Status: DC

## 2012-04-28 NOTE — Progress Notes (Signed)
Subjective:    Patient ID: Leslie [name], female    DOB: 06-24-1933    MRN: [id number]   HPI 63 yowf quit smoking around 1980 due to sob but much better p  quit even with colds then started up with audible wheezing onset Oct 2012 > referred to pulmonary clinic by Drs Shirlee Latch and [facility].   08/01/2011 Initial pulmonary office eval cc new onset wheezing and mostly dry coughing x 2weeks abrupt onset seems to be improving on its own although took inhalers (proaire and dulera)  for a few days but stopped x 10 days before the ov and no significant flare off inhalers and on coreg.  Presently  Still mild doe and also overt reflux symptoms despite prilosec dosed ac q am while on fish oil and vit e in oil base.  No h/o  Allergies, asthma or sinus complaints, no purulent sputum rec gerd diet/ pfts   10/10/2011 f/u ov/Wert cc doe with walking at store or doing housework, feels need albuterol at least once during the day with minimal assoc cough but it does help her breathing. Takes prilosec daily but not ac. Still on coreg.  >>changed to bystolic   04/28/2012 Acute OV  Complains of wheezing, increased SOB, prod cough with clear mucus x2 months, worse at night.  Has wheezing in her throat , going on for >9 months but worse for last 2 months  Has a wheezing sound after laughing, coughing and nighttime.  No overt reflux.  Does have some post nasal drip at times.  Cough is minimally productive Clears throat a lot  Uses mints to help .  No fever or discolored mucus.  Wears out easily  No daytime hypersolom. , feels rested.  Previous PFT showed preserved FEV1 .     ROS :  Constitutional:   No  weight loss, night sweats,  Fevers, chills, ++ fatigue, or  lassitude.  HEENT:   No headaches,  Difficulty swallowing,  Tooth/dental problems, or  Sore throat,                No sneezing, itching, ear ache,  +nasal congestion, post nasal drip,   CV:  No chest pain,  Orthopnea, PND, swelling in lower  extremities, anasarca, dizziness, palpitations, syncope.   GI  No heartburn, indigestion, abdominal pain, nausea, vomiting, diarrhea, change in bowel habits, loss of appetite, bloody stools.   Resp:    No coughing up of blood.  No change in color of mucus.    No chest wall deformity  Skin: no rash or lesions.  GU: no dysuria, change in color of urine, no urgency or frequency.  No flank pain, no hematuria   MS:  No joint pain or swelling.  No decreased range of motion.  No back pain.  Psych:  No change in mood or affect. No depression or anxiety.  No memory loss.                  Objective:   Physical Exam  Wt 179 08/01/11 > 10/10/2011  184 >187 04/28/2012   amb wf nad  GEN: A/Ox3; pleasant , NAD, well nourished   HEENT:  [place]/AT,  EACs-clear, TMs-wnl, NOSE-clear, THROAT-clear, no lesions, no postnasal drip or exudate noted.    NECK:  Supple w/ fair ROM; no JVD; normal carotid impulses w/o bruits; no thyromegaly or nodules palpated; no lymphadenopathy.  RESP  Clear  P & A; w/o, wheezes/ rales/ or rhonchi.no accessory muscle use, no dullness  to percussion. Upper airway audible psuedowheeze   CARD:  RRR, no m/r/g  , no peripheral edema, pulses intact, no cyanosis or clubbing.  GI:   Soft & nt; nml bowel sounds; no organomegaly or masses detected.  Musco: Warm bil, no deformities or joint swelling noted.   Neuro: alert, no focal deficits noted.    Skin: Warm, no lesions or rashes   cxr 07/19/11 Findings: Trachea is midline. Heart size stable. Scarring in the  lingula and right middle lobe. Lungs are otherwise clear. No  pleural fluid. Lower thoracic vertebroplasty is noted.  IMPRESSION:  No acute findings      Assessment & Plan:

## 2012-04-28 NOTE — Patient Instructions (Addendum)
Delsym 2 tsp Twice daily for cough Chlortabs 4mg  2 at bedtime and 1 in am (Chlorphenaramine)  Add Pepcid 20mg  At bedtime.  Prednisone taper over next week  Continue on Omeprazole 20mg  daily before meal .  We are checking your oxygen overnight  Avoid coughing and throat clearing.  Use sips of water and sugarless candy to help avoid coughing or throat clearing.  NO MINTS  Hold fish oil and vitamin E  Please contact office for sooner follow up if symptoms do not improve or worsen or seek emergency care  follow up Dr. Sherene Sires  Or Egor Fullilove in 2 weeks and As needed

## 2012-04-28 NOTE — Telephone Encounter (Signed)
Spoke with pt about recent lab results 

## 2012-04-28 NOTE — Telephone Encounter (Signed)
New Problem:     PAtient returned your call.  Please call back.

## 2012-04-28 NOTE — Assessment & Plan Note (Addendum)
Flare with upper airway cough   Plan:  Delsym 2 tsp Twice daily for cough Chlortabs 4mg  2 at bedtime and 1 in am (Chlorphenaramine)  Add Pepcid 20mg  At bedtime.  Prednisone taper over next week  Continue on Omeprazole 20mg  daily before meal .  We are checking your oxygen overnight  Avoid coughing and throat clearing.  Use sips of water and sugarless candy to help avoid coughing or throat clearing.  NO MINTS  Hold fish oil and vitamin E  Please contact office for sooner follow up if symptoms do not improve or worsen or seek emergency care  follow up Dr. Sherene Sires  Or Richad Ramsay in 2 weeks and As needed

## 2012-05-05 NOTE — Addendum Note (Signed)
Addended by: Micki Riley C on: 05/05/2012 04:08 PM   Modules accepted: Orders

## 2012-05-21 ENCOUNTER — Ambulatory Visit (INDEPENDENT_AMBULATORY_CARE_PROVIDER_SITE_OTHER): Payer: Medicare Other | Admitting: Internal Medicine

## 2012-05-21 ENCOUNTER — Encounter: Payer: Self-pay | Admitting: Internal Medicine

## 2012-05-21 VITALS — BP 130/64 | HR 67 | Temp 98.4°F | Ht 64.5 in | Wt 190.2 lb

## 2012-05-21 DIAGNOSIS — J449 Chronic obstructive pulmonary disease, unspecified: Secondary | ICD-10-CM

## 2012-05-21 MED ORDER — MOMETASONE FURO-FORMOTEROL FUM 100-5 MCG/ACT IN AERO: INHALATION_SPRAY | RESPIRATORY_TRACT | Status: DC

## 2012-05-21 NOTE — Patient Instructions (Addendum)
Dulera 100 2 puff each am   Prednisone 10 mg take  4 each am x 2 days,   2 each am x 2 days,  1 each am x2days and stop   Work on inhaler technique:  relax and gently blow all the way out then take a nice smooth deep breath back in, triggering the inhaler at same time you start breathing in.  Hold for up to 5 seconds if you can.  Rinse and gargle with water when done   If your mouth or throat starts to bother you,   I suggest you time the inhaler to your dental care and after using the inhaler(s) brush teeth and tongue with a baking soda containing toothpaste and when you rinse this out, gargle with it first to see if this helps your mouth and throat.    Please schedule a follow up office visit in 4 weeks, sooner if needed

## 2012-05-21 NOTE — Progress Notes (Signed)
Subjective:    Patient ID: Leslie [name], female    DOB: 1933-06-13    MRN: [id number]   HPI 26 yowf quit smoking around 1980 due to sob but much better p  quit even with colds then started up with audible wheezing onset Oct 2012 > referred to pulmonary clinic by Drs Shirlee Latch and [facility] with minimal airflow obstruction on pft's 10/10/2011   08/01/2011 Initial pulmonary office eval cc new onset wheezing and mostly dry coughing x 2weeks abrupt onset seems to be improving on its own although took inhalers (proaire and dulera)  for a few days but stopped x 10 days before the ov and no significant flare off inhalers and on coreg.  Presently  Still mild doe and also overt reflux symptoms despite prilosec dosed ac q am while on fish oil and vit e in oil base.  No h/o  Allergies, asthma or sinus complaints, no purulent sputum rec gerd diet/ pfts   10/10/2011 f/u ov/Wert cc doe with walking at store or doing housework, feels need albuterol at least once during the day with minimal assoc cough but it does help her breathing. Takes prilosec daily but not ac. Still on coreg.  >>changed to bystolic   04/28/2012 Acute OV / NP Complains of wheezing, increased SOB, prod cough with clear mucus x2 months, worse at night.  Has wheezing in her throat , going on for >9 months but worse for last 2 months  Has a wheezing sound after laughing, coughing and nighttime.  No overt reflux.  Does have some post nasal drip at times.  Cough is minimally productive Clears throat a lot  Uses mints to help .  No fever or discolored mucus.  Wears out easily  No daytime hypersolom. , feels rested.  Previous PFT showed preserved FEV1 .  rec Delsym 2 tsp Twice daily for cough Chlortabs 4mg  2 at bedtime and 1 in am (Chlorphenaramine)  Add Pepcid 20mg  At bedtime.  Prednisone taper over next week  Continue on Omeprazole 20mg  daily before meal .  We are checking your oxygen overnight  Avoid coughing and throat clearing.    Use sips of water and sugarless candy to help avoid coughing or throat clearing.  NO MINTS  Hold fish oil and vitamin E    05/21/2012 f/u ov/Wert cc quite a bit better p prednisone then worse again with coughing/ wheezing/mild sob some better p albuterol twice daily but Sleeping ok without nocturnal  or early am exacerbation  of respiratory  c/o's or need for noct saba. Also denies any obvious fluctuation of symptoms with weather or environmental changes or other aggravating or alleviating factors except as outlined above.   No   purulent sputum or sinus/hb symptoms on present rx.  ROS  The following are not active complaints unless bolded sore throat, dysphagia, dental problems, itching, sneezing,  nasal congestion or excess/ purulent secretions, ear ache,   fever, chills, sweats, unintended wt loss, pleuritic or exertional cp, hemoptysis,  orthopnea pnd or leg swelling, presyncope, palpitations, heartburn, abdominal pain, anorexia, nausea, vomiting, diarrhea  or change in bowel or urinary habits, change in stools or urine, dysuria,hematuria,  rash, arthralgias, visual complaints, headache, numbness weakness or ataxia or problems with walking or coordination,  change in mood/affect or memory.                              Objective:   Physical Exam  Wt  179 08/01/11 > 10/10/2011  184 >187 04/28/2012 > 05/21/2012  190  amb wf nad  GEN: A/Ox3; pleasant , NAD, well nourished   HEENT:  [place]/AT,  EACs-clear, TMs-wnl, NOSE-clear, THROAT-clear, no lesions, no postnasal drip or exudate noted.    NECK:  Supple w/ fair ROM; no JVD; normal carotid impulses w/o bruits; no thyromegaly or nodules palpated; no lymphadenopathy.  RESP  Clear  P & A; w/o, wheezes/ rales/ or rhonchi.no accessory muscle use, no dullness to percussion. Upper airway audible psuedowheeze   CARD:  RRR, no m/r/g  , no peripheral edema, pulses intact, no cyanosis or clubbing.  GI:   Soft & nt; nml bowel sounds; no  organomegaly or masses detected.  Musco: Warm bil, no deformities or joint swelling noted.   Neuro: alert, no focal deficits noted.    Skin: Warm, no lesions or rashes   cxr 07/19/11 Findings: Trachea is midline. Heart size stable. Scarring in the  lingula and right middle lobe. Lungs are otherwise clear. No  pleural fluid. Lower thoracic vertebroplasty is noted.  IMPRESSION:  No acute findings      Assessment & Plan:

## 2012-05-22 ENCOUNTER — Telehealth: Payer: Self-pay | Admitting: Internal Medicine

## 2012-05-22 MED ORDER — PREDNISONE 10 MG PO TABS: ORAL_TABLET | ORAL | Status: DC

## 2012-05-22 NOTE — Assessment & Plan Note (Signed)
-   PFT's  10/10/2011  FEV1 1.46 (78%) ratio 68 and no better p B2, DLCO 60 corrects to 110.   - HFA 75% p coaching 05/21/12  DDX of  difficult airways managment all start with A and  include Adherence, Ace Inhibitors, Acid Reflux, Active Sinus Disease, Alpha 1 Antitripsin deficiency, Anxiety masquerading as Airways dz,  ABPA,  allergy(esp in young), Aspiration (esp in elderly), Adverse effects of DPI,  Active smokers, plus two Bs  = Bronchiectasis and Beta blocker use..and one C= CHF  Adherence is always the initial "prime suspect" and is a multilayered concern that requires a "trust but verify" approach in every patient - starting with knowing how to use medications, especially inhalers, correctly, keeping up with refills and understanding the fundamental difference between maintenance and prns vs those medications only taken for a very short course and then stopped and not refilled. The proper method of use, as well as anticipated side effects, of a metered-dose inhaler are discussed and demonstrated to the patient. Improved effectiveness after extensive coaching during this visit to a level of approximately  75%  Try dulera 100 2 puffs each am given how well she does p prednisone but knowing many ics can actually make the cough, her primary concern, worse.   In meantime, continue rx for Acid reflux   See instructions for specific recommendations which were reviewed directly with the patient who was given a copy with highlighter outlining the key components.

## 2012-05-22 NOTE — Telephone Encounter (Signed)
Pt stated that the prednisone taper was not sent in to the pharmacy yesterday.  rx for the prednisone has been sent to the pts pharmacy and i called and attempted to lmom to make pt aware but no machine.  Will try back later.

## 2012-05-22 NOTE — Telephone Encounter (Signed)
Spoke with pt and notified that the rx for pred taper was sent to pharm. She verbalized understanding and states nothing further needed.

## 2012-06-09 ENCOUNTER — Telehealth: Payer: Self-pay | Admitting: Internal Medicine

## 2012-06-09 NOTE — Telephone Encounter (Signed)
lst seen 04/08/12 here  dexa done 04/24/12 Please advise

## 2012-06-09 NOTE — Telephone Encounter (Signed)
Patient called stating that she would like a call back with bone density results. Please assist.  °

## 2012-06-09 NOTE — Telephone Encounter (Signed)
Called and spoke with pt and she states one of her doctors took her off vitamin D.  Pt has been taking 500 mg of calicum twice a day.

## 2012-06-09 NOTE — Telephone Encounter (Signed)
Please call/notify patient that lab/test/procedure is normal;  Take a calcium supplement, plus [phone] units of vitamin D

## 2012-06-10 ENCOUNTER — Telehealth: Payer: Self-pay | Admitting: Internal Medicine

## 2012-06-10 MED ORDER — OLMESARTAN MEDOXOMIL-HCTZ 40-12.5 MG PO TABS: 1.0000 | ORAL_TABLET | Freq: Every day | ORAL | Status: DC

## 2012-06-10 MED ORDER — ATORVASTATIN CALCIUM 20 MG PO TABS: 20.0000 mg | ORAL_TABLET | Freq: Every day | ORAL | Status: DC

## 2012-06-10 MED ORDER — AMLODIPINE BESYLATE 5 MG PO TABS: 5.0000 mg | ORAL_TABLET | Freq: Every day | ORAL | Status: DC

## 2012-06-10 NOTE — Telephone Encounter (Signed)
I agree with the decision to in crease the Prilosec to bid

## 2012-06-10 NOTE — Telephone Encounter (Signed)
Rx sent to pharmacy for 90 day supply.  

## 2012-06-10 NOTE — Telephone Encounter (Signed)
Patient calling to report for the last 2 months, she has had problems with swallowing and diarrhea. She states the problems have gotten worse in the last few weeks. States it takes her swallowing twice sometimes to get food or water to go down. She will increase Omeprazole to BID until OV. She reports diarrhea off and on. Reports urgency and uncontrolled diarrhea at times. Diarrhea is becoming more frequent. Denies abdominal pain, nausea or vomiting. Denies recent antibiotic use. She was on a Prednisone taper which she completed 2 weeks ago. Patient last SBCE, EGD, Colon- 05/2007- AVM. Hx GERD, diverticulosis. Scheduled patient for OV on 06/16/12 with Dr. Juanda Chance.

## 2012-06-10 NOTE — Telephone Encounter (Signed)
Patient called stating that she would like a refill of her amlodipine,atorvastatin, and benicar called into [facility] on [address] for a 90 day supply as she will be gone for months at a time. Please assist.

## 2012-06-12 ENCOUNTER — Encounter: Payer: Self-pay | Admitting: *Deleted

## 2012-06-16 ENCOUNTER — Encounter: Payer: Self-pay | Admitting: Internal Medicine

## 2012-06-16 ENCOUNTER — Ambulatory Visit (INDEPENDENT_AMBULATORY_CARE_PROVIDER_SITE_OTHER): Payer: Medicare Other | Admitting: Internal Medicine

## 2012-06-16 VITALS — BP 126/70 | HR 64 | Ht 63.5 in | Wt 189.2 lb

## 2012-06-16 DIAGNOSIS — K219 Gastro-esophageal reflux disease without esophagitis: Secondary | ICD-10-CM

## 2012-06-16 DIAGNOSIS — R197 Diarrhea, unspecified: Secondary | ICD-10-CM

## 2012-06-16 DIAGNOSIS — R1319 Other dysphagia: Secondary | ICD-10-CM

## 2012-06-16 MED ORDER — OMEPRAZOLE 40 MG PO CPDR: 40.0000 mg | DELAYED_RELEASE_CAPSULE | Freq: Every day | ORAL | Status: DC

## 2012-06-16 MED ORDER — DICYCLOMINE HCL 20 MG PO TABS: 20.0000 mg | ORAL_TABLET | Freq: Every day | ORAL | Status: DC

## 2012-06-16 NOTE — Patient Instructions (Addendum)
We have sent the following medications to your pharmacy for you to pick up at your convenience: Omeprazole 40 mg daily,samples of Nexiem 40 mg daily Bentyl 20 mg every morning Please follow up with Dr Juanda Chance in 2 months. CC: Dr Amador Cunas

## 2012-06-16 NOTE — Progress Notes (Signed)
Leslie [name] 1933/03/29 MRN [id number]   History of Present Illness:  This is a 78 year old white female with gastroesophageal reflux for which she takes omeprazole 20 mg a day. She has had increased burning and a scratchy feeling in the back of her throat. She denies dysphagia. Her voice is hoarse at times. She denies coughing at night. Another problem has been loose stools. For the past 3 months, she has noticed  loose, urgent bowel movements especially in the mornings. She denies any rectal bleeding. Her last colonoscopy in 2008 was normal except for diverticulosis. She has a history of a GI bleed in 2008 for which she was hospitalized and required blood transfusions of 4 units of packed cells. On an upper endoscopy, she had a duodenal AVM and on a small bowel capsule endoscopy she had AVMs in the stomach and duodenum. She has been on chronic iron supplements. There is no family history of colon cancer. She stopped smoking in 2010. She is followed by Dr.Wert for asthma. There is a history of a prior cholecystectomy and history of non-ST elevation MI in 2007. She has been on Glucophage 1 g daily for many years.   Past Medical History  Diagnosis Date  . ALLERGIC RHINITIS, SEASONAL 05/06/2007  . ANEMIA NEC 05/20/2007  . ARTERIOVENOUS MALFORMATION 09/20/2008  . BACK PAIN, LUMBAR 09/21/2008  . CARDIOMYOPATHY, SECONDARY 11/21/2008  . DEGENERATIVE JOINT DISEASE, CERVICAL SPINE 05/06/2007  . DEPRESSION 10/31/2009  . DIABETES MELLITUS, TYPE II 05/06/2007  . DIVERTICULOSIS OF COLON 09/20/2008  . G I BLEED 10/14/2007  . GERD 05/06/2007  . HIATAL HERNIA 09/20/2008  . HYPERLIPIDEMIA 05/06/2007  . HYPERTENSION 05/06/2007  . MYOCARDIAL INFARCTION, HX OF 05/06/2007  . NEPHROLITHIASIS, HX OF 07/14/2007  . OSTEOARTHRITIS 05/06/2007  . PHLEBITIS&THROMBOPHLEB SUP VEINS UPPER EXTREM 07/25/2009  . TOBACCO USE, QUIT 07/20/2009  . Blood type O+    Past Surgical History  Procedure Date  . Abdominal hysterectomy   .  Appendectomy   . Cholecystectomy   . Kidney stone surgery     x 2  . Knee arthroscopy     left  . Cardiac catheterization   . Cervical disc surgery     reports that she quit smoking about 27 years ago. Her smoking use included Cigarettes. She has a 30 pack-year smoking history. She has never used smokeless tobacco. She reports that she drinks alcohol. She reports that she does not use illicit drugs. family history includes Breast cancer in her sister and unspecified family member; Diabetes in her sister and unspecified family member; Heart attack in her brother and father; Heart disease in her mother and sister; and Heart failure in her mother. Allergies  Allergen Reactions  . Ivp Dye (Iodinated Diagnostic Agents) Anaphylaxis  . Buprenorphine Hcl   . Macrolides And Ketolides   . Penicillins Hives  . Streptomycin   . Sulfonamide Derivatives Hives        Review of Systems:Burning in the back of her throat. Occasional dysphagia to liquids denies abdominal pain. Her weight has increased  The remainder of the 10 point ROS is negative except as outlined in H&P   Physical Exam: General appearance  Well developed, in no distress. Eyes- non icteric. HEENT nontraumatic, normocephalic. Mouth no lesions, tongue papillated, no cheilosis. Neck supple without adenopathy, thyroid not enlarged, no carotid bruits, no JVD. Lungs Clear to auscultation bilaterally.No wheezes  Cor normal S1, normal S2, regular rhythm, no murmur,  quiet precordium. Abdomen: Protuberant soft with minimal tenderness in  epigastrium. Normal active bowel sounds. Left lower quadrant unremarkable.  Rectal:Soft Hemoccult negative stool.  Extremities no pedal edema. Skin no lesions. Neurological alert and oriented x 3. Psychological normal mood and affect.  Assessment and Plan:  Problem #1 Recent change in bowel habits to having loose urgent bowel movements could be related to medication such as Glucophage or due to  postcholecystectomy syndrome. Irritable bowel syndrome is another possibility. She has been under recent stress. Her husband passed away about 2 years ago. She has known diverticulosis of the left colon which was rather mild on her last exam. Today, there is no evidence of occult GI blood loss. Her scheduled recall colonoscopy is in August 2018. If her diarrhea continues despite of starting her on Bentyl 20 mg daily, I would go ahead and proceed with colonoscopy and biopsies to rule out microscopic colitis. We may need to consider reducing her Glucophage to 500 mg daily.  Problem #2 Burning in the back of her throat likely secondary to poorly controlled gastroesophageal reflux. Dysphagia to liquids suggests dysmotility or spasm rather than an actual stricture. We will increase her Prilosec to 40 mg daily. I have also given her samples of Nexium to take for the next 10 days. If the symptoms continue, we would consider an upper endoscopy. She will return in 2 months.  Problem #3 AVMs of the small bowel. She is Hemoccult-negative today.  06/16/2012 Leslie [name]

## 2012-06-18 ENCOUNTER — Ambulatory Visit (INDEPENDENT_AMBULATORY_CARE_PROVIDER_SITE_OTHER): Payer: Medicare Other | Admitting: Adult Health

## 2012-06-18 ENCOUNTER — Ambulatory Visit: Payer: Medicare Other | Admitting: Internal Medicine

## 2012-06-18 ENCOUNTER — Encounter: Payer: Self-pay | Admitting: Adult Health

## 2012-06-18 VITALS — BP 126/64 | HR 60 | Temp 96.9°F | Ht 63.5 in | Wt 190.8 lb

## 2012-06-18 DIAGNOSIS — J449 Chronic obstructive pulmonary disease, unspecified: Secondary | ICD-10-CM

## 2012-06-18 DIAGNOSIS — Z23 Encounter for immunization: Secondary | ICD-10-CM

## 2012-06-18 NOTE — Assessment & Plan Note (Signed)
Upper airway cough /RAD complicated by GERD/AR  Much improved on current regimen.  Advised to cont and follow up in 6 weeks and As needed

## 2012-06-18 NOTE — Patient Instructions (Signed)
Continue on Dulera 2 puffs in am , brush/rinse/gargle after use.  Delsym 2 tsp Twice daily As needed   Chlortabs 4mg  2 at bedtime As needed  Drainage/throat tickle  Pepcid 20mg  At bedtime.  Omeprazole 40mg  daily before meal .  Avoid coughing and throat clearing.  Use sips of water and sugarless candy to help avoid coughing or throat clearing.  NO MINTS  Hold fish oil and vitamin E  Please contact office for sooner follow up if symptoms do not improve or worsen or seek emergency care  follow up Dr. Sherene Sires    6 weeks and As needed   Flu shot today

## 2012-06-18 NOTE — Progress Notes (Signed)
Subjective:    Patient ID: Leslie [name], female    DOB: 09-06-1933    MRN: [id number]   HPI 34 yowf quit smoking around 1980 due to sob but much better p  quit even with colds then started up with audible wheezing onset Oct 2012 > referred to pulmonary clinic by Drs Shirlee Latch and [facility] with minimal airflow obstruction on pft's 10/10/2011   08/01/2011 Initial pulmonary office eval cc new onset wheezing and mostly dry coughing x 2weeks abrupt onset seems to be improving on its own although took inhalers (proaire and dulera)  for a few days but stopped x 10 days before the ov and no significant flare off inhalers and on coreg.  Presently  Still mild doe and also overt reflux symptoms despite prilosec dosed ac q am while on fish oil and vit e in oil base.  No h/o  Allergies, asthma or sinus complaints, no purulent sputum rec gerd diet/ pfts   10/10/2011 f/u ov/Wert cc doe with walking at store or doing housework, feels need albuterol at least once during the day with minimal assoc cough but it does help her breathing. Takes prilosec daily but not ac. Still on coreg.  >>changed to bystolic   04/28/2012 Acute OV / NP Complains of wheezing, increased SOB, prod cough with clear mucus x2 months, worse at night.  Has wheezing in her throat , going on for >9 months but worse for last 2 months  Has a wheezing sound after laughing, coughing and nighttime.  No overt reflux.  Does have some post nasal drip at times.  Cough is minimally productive Clears throat a lot  Uses mints to help .  No fever or discolored mucus.  Wears out easily  No daytime hypersolom. , feels rested.  Previous PFT showed preserved FEV1 .  rec Delsym 2 tsp Twice daily for cough Chlortabs 4mg  2 at bedtime and 1 in am (Chlorphenaramine)  Add Pepcid 20mg  At bedtime.  Prednisone taper over next week  Continue on Omeprazole 20mg  daily before meal .  We are checking your oxygen overnight  Avoid coughing and throat clearing.    Use sips of water and sugarless candy to help avoid coughing or throat clearing.  NO MINTS  Hold fish oil and vitamin E    05/21/2012 f/u ov/Wert cc quite a bit better p prednisone then worse again with coughing/ wheezing/mild sob some better p albuterol twice daily but Sleeping ok without nocturnal  or early am exacerbation  of respiratory  c/o's or need for noct saba. Also denies any obvious fluctuation of symptoms with weather or environmental changes or other aggravating or alleviating factors except as outlined above.  >>dulera and steroid taper   06/18/2012 Follow up  Returns for 1 month follow up  Reports breathing is "much better" since last ov.  is tolerating the dulera well.    Seen by GI , tx w/ nexium x 10 days and increased dose of prilosec after this.  Feeling so much better w/ near resolved cough .  Still has some throat tickle  No wheezing or chest pain or dyspnea.  Participating in Senior games this weekend         ROS:  Constitutional:   No  weight loss, night sweats,  Fevers, chills, fatigue, or  lassitude.  HEENT:   No headaches,  Difficulty swallowing,  Tooth/dental problems, or  Sore throat,  No sneezing, itching, ear ache, nasal congestion,  +post nasal drip,   CV:  No chest pain,  Orthopnea, PND, swelling in lower extremities, anasarca, dizziness, palpitations, syncope.   GI  No   abdominal pain, nausea, vomiting, diarrhea, change in bowel habits, loss of appetite, bloody stools.   Resp: No shortness of breath with exertion or at rest.  No excess mucus, no productive cough,  No non-productive cough,  No coughing up of blood.  No change in color of mucus.  No wheezing.  No chest wall deformity  Skin: no rash or lesions.  GU: no dysuria, change in color of urine, no urgency or frequency.  No flank pain, no hematuria   MS:  No joint pain or swelling.  No decreased range of motion.  No back pain.  Psych:  No change in mood or affect. No  depression or anxiety.  No memory loss.                       Objective:   Physical Exam  Wt 179 08/01/11 > 10/10/2011  184 >187 04/28/2012 > 05/21/2012  190  amb wf nad  GEN: A/Ox3; pleasant , NAD, well nourished   HEENT:  /AT,  EACs-clear, TMs-wnl, NOSE-clear, THROAT-clear, no lesions, no postnasal drip or exudate noted.    NECK:  Supple w/ fair ROM; no JVD; normal carotid impulses w/o bruits; no thyromegaly or nodules palpated; no lymphadenopathy.  RESP  Clear  P & A; w/o, wheezes/ rales/ or rhonchi.no accessory muscle use, no dullness to percussion.    CARD:  RRR, no m/r/g  , no peripheral edema, pulses intact, no cyanosis or clubbing.  GI:   Soft & nt; nml bowel sounds; no organomegaly or masses detected.  Musco: Warm bil, no deformities or joint swelling noted.   Neuro: alert, no focal deficits noted.    Skin: Warm, no lesions or rashes   cxr 07/19/11 Findings: Trachea is midline. Heart size stable. Scarring in the  lingula and right middle lobe. Lungs are otherwise clear. No  pleural fluid. Lower thoracic vertebroplasty is noted.  IMPRESSION:  No acute findings      Assessment & Plan:

## 2012-07-28 ENCOUNTER — Encounter (INDEPENDENT_AMBULATORY_CARE_PROVIDER_SITE_OTHER): Payer: Medicare Other

## 2012-07-28 DIAGNOSIS — I6529 Occlusion and stenosis of unspecified carotid artery: Secondary | ICD-10-CM

## 2012-07-28 DIAGNOSIS — I10 Essential (primary) hypertension: Secondary | ICD-10-CM

## 2012-07-30 ENCOUNTER — Encounter: Payer: Self-pay | Admitting: Internal Medicine

## 2012-07-30 ENCOUNTER — Ambulatory Visit (INDEPENDENT_AMBULATORY_CARE_PROVIDER_SITE_OTHER): Payer: Medicare Other | Admitting: Internal Medicine

## 2012-07-30 VITALS — BP 132/72 | HR 86 | Temp 98.1°F | Resp 22 | Ht 64.0 in | Wt 195.0 lb

## 2012-07-30 DIAGNOSIS — E785 Hyperlipidemia, unspecified: Secondary | ICD-10-CM

## 2012-07-30 DIAGNOSIS — E119 Type 2 diabetes mellitus without complications: Secondary | ICD-10-CM

## 2012-07-30 DIAGNOSIS — I10 Essential (primary) hypertension: Secondary | ICD-10-CM

## 2012-07-30 DIAGNOSIS — I6529 Occlusion and stenosis of unspecified carotid artery: Secondary | ICD-10-CM

## 2012-07-30 DIAGNOSIS — J069 Acute upper respiratory infection, unspecified: Secondary | ICD-10-CM

## 2012-07-30 DIAGNOSIS — Z0000 Encounter for general adult medical examination without abnormal findings: Secondary | ICD-10-CM

## 2012-07-30 DIAGNOSIS — J449 Chronic obstructive pulmonary disease, unspecified: Secondary | ICD-10-CM

## 2012-07-30 DIAGNOSIS — R062 Wheezing: Secondary | ICD-10-CM

## 2012-07-30 DIAGNOSIS — I252 Old myocardial infarction: Secondary | ICD-10-CM

## 2012-07-30 LAB — HEMOGLOBIN A1C: Hgb A1c MFr Bld: 7 % — ABNORMAL HIGH (ref 4.6–6.5)

## 2012-07-30 NOTE — Progress Notes (Signed)
Subjective:    Patient ID: Leslie [name], female    DOB: 06/09/1933, 78 y.o.   MRN: [id number]  HPI  78 year old patient who is seen today for a preventive health examination. She is followed by cardiology and pulmonary. Her cardiopulmonary status has been stable. For the past week she's had a URI with some increasing cough and wheezing. She's had a recent carotid duplex study. She has type 2 diabetes which has been stable. She receives annual gynecologic evaluations. She has treated hypertension type 2 diabetes as well as dyslipidemia  Past Medical History  Diagnosis Date  . ALLERGIC RHINITIS, SEASONAL 05/06/2007  . ANEMIA NEC 05/20/2007  . ARTERIOVENOUS MALFORMATION 09/20/2008  . BACK PAIN, LUMBAR 09/21/2008  . CARDIOMYOPATHY, SECONDARY 11/21/2008  . DEGENERATIVE JOINT DISEASE, CERVICAL SPINE 05/06/2007  . DEPRESSION 10/31/2009  . DIABETES MELLITUS, TYPE II 05/06/2007  . DIVERTICULOSIS OF COLON 09/20/2008  . G I BLEED 10/14/2007  . GERD 05/06/2007  . HIATAL HERNIA 09/20/2008  . HYPERLIPIDEMIA 05/06/2007  . HYPERTENSION 05/06/2007  . MYOCARDIAL INFARCTION, HX OF 05/06/2007  . NEPHROLITHIASIS, HX OF 07/14/2007  . OSTEOARTHRITIS 05/06/2007  . PHLEBITIS&THROMBOPHLEB SUP VEINS UPPER EXTREM 07/25/2009  . TOBACCO USE, QUIT 07/20/2009  . Blood type O+     History   Social History  . Marital Status: Widowed    Spouse Name: N/A    Number of Children: 0  . Years of Education: N/A   Occupational History  . Not on file.   Social History Main Topics  . Smoking status: Former Smoker -- 1.0 packs/day for 30 years    Types: Cigarettes    Quit date: 07/15/1984  . Smokeless tobacco: Never Used  . Alcohol Use: Yes     2-3 glasses of wine  . Drug Use: No  . Sexually Active: Not on file   Other Topics Concern  . Not on file   Social History Narrative  . No narrative on file    Past Surgical History  Procedure Date  . Abdominal hysterectomy   . Appendectomy   . Cholecystectomy   . Kidney stone  surgery     x 2  . Knee arthroscopy     left  . Cardiac catheterization   . Cervical disc surgery     Family History  Problem Relation Age of Onset  . Heart disease Mother   . Heart failure Mother   . Heart attack Father   . Heart attack Brother   . Heart disease Sister     x 2  . Diabetes Sister   . Diabetes      aunts x 2  . Breast cancer Sister   . Breast cancer      Allergies  Allergen Reactions  . Ivp Dye (Iodinated Diagnostic Agents) Anaphylaxis  . Buprenorphine Hcl   . Macrolides And Ketolides   . Penicillins Hives  . Streptomycin   . Sulfonamide Derivatives Hives    Current Outpatient Prescriptions on File Prior to Visit  Medication Sig Dispense Refill  . amLODipine (NORVASC) 5 MG tablet Take 1 tablet (5 mg total) by mouth daily.  90 tablet  1  . aspirin 81 MG tablet Take 81 mg by mouth daily.        Marland Kitchen atorvastatin (LIPITOR) 20 MG tablet Take 1 tablet (20 mg total) by mouth daily.  90 tablet  2  . calcium gluconate 500 MG tablet Take 500 mg by mouth 2 (two) times daily.       Marland Kitchen  chlorpheniramine (CHLOR-TRIMETON) 4 MG tablet Take 8 mg by mouth at bedtime.      . Cholecalciferol (VITAMIN D) 400 UNITS capsule Take 400 Units by mouth daily.        . citalopram (CELEXA) 40 MG tablet       . dicyclomine (BENTYL) 20 MG tablet Take 1 tablet (20 mg total) by mouth daily with breakfast.  30 tablet  1  . famotidine (PEPCID) 20 MG tablet Take 20 mg by mouth at bedtime.      . ferrous sulfate 325 (65 FE) MG EC tablet Take 325 mg by mouth daily with breakfast.        . Glucosamine-Chondroitin (COSAMIN DS PO) Take 1 tablet by mouth at bedtime.        Marland Kitchen HYDROcodone-acetaminophen (NORCO) 7.5-325 MG per tablet Take 1 tablet by mouth every 6 (six) hours as needed. For pain       . ibuprofen (ADVIL,MOTRIN) 200 MG tablet Take 200 mg by mouth every 6 (six) hours as needed.      . metFORMIN (GLUCOPHAGE-XR) 500 MG 24 hr tablet TAKE 2 TABLETS BY MOUTH AT BEDTIME  180 tablet  3  .  mometasone-formoterol (DULERA) 100-5 MCG/ACT AERO 2 puffs each am  1 Inhaler  0  . Multiple Vitamins-Minerals (CENTRUM ULTRA WOMENS) TABS Take 1 tablet by mouth daily.      . nebivolol (BYSTOLIC) 10 MG tablet Take 1 tablet (10 mg total) by mouth daily.  90 tablet  6  . olmesartan-hydrochlorothiazide (BENICAR HCT) 40-12.5 MG per tablet Take 1 tablet by mouth daily.  90 tablet  1  . omeprazole (PRILOSEC) 40 MG capsule Take 1 capsule (40 mg total) by mouth daily.  90 capsule  0  . traZODone (DESYREL) 100 MG tablet TAKE 1 TABLET BY MOUTH AT BEDTIME  90 tablet  1  . zolpidem (AMBIEN) 5 MG tablet Take 5 mg by mouth at bedtime as needed.        BP 132/72  Pulse 86  Temp 98.1 F (36.7 C) (Oral)  Resp 22  Ht 5\' 4"  (1.626 m)  Wt 195 lb (88.451 kg)  BMI 33.47 kg/m2  SpO2 93%   1. Risk factors, based on past  M,S,F history patient has known coronary artery disease. Cardiovascular risk factors include hypertension diabetes and dyslipidemia-   2.  Physical activities:Remains quite active does have some pulmonary disease which has been stable. She is to do with the maintenance bronchodilators   3.  Depression/mood:Prior history depression which has been stable. Remains on Celexa with benefit   4.  Hearing: no significant deficits  5.  ADL's:Independent in all aspects of daily living   6.  Fall risk:Low   7.  Home safety:No problems identified   8.  Height weight, and visual acuity;Height and weight stable no change in visual acuity. He is scheduled for right cataract extraction surgery soon is status post prior left had her extraction surgery   9.  Counseling:Exercise modest weight loss encouraged   10. Lab orders based on risk factors:Hemoglobin A1c will be reviewed   11. Referral :Follow cardiology and pulmonary medicine   12. Care plan:Followup colonoscopy 5 years   13. Cognitive assessment: Alert and oriented with normal affect. No cognitive dysfunction       Review of Systems   Constitutional: Positive for fatigue. Negative for fever, appetite change and unexpected weight change.  HENT: Positive for congestion. Negative for hearing loss, ear pain, nosebleeds, sore throat, mouth sores,  trouble swallowing, neck stiffness, dental problem, voice change, sinus pressure and tinnitus.   Eyes: Negative for photophobia, pain, redness and visual disturbance.  Respiratory: Positive for cough and wheezing. Negative for chest tightness and shortness of breath.   Cardiovascular: Negative for chest pain, palpitations and leg swelling.  Gastrointestinal: Negative for nausea, vomiting, abdominal pain, diarrhea, constipation, blood in stool, abdominal distention and rectal pain.  Genitourinary: Negative for dysuria, urgency, frequency, hematuria, flank pain, vaginal bleeding, vaginal discharge, difficulty urinating, genital sores, vaginal pain, menstrual problem and pelvic pain.  Musculoskeletal: Negative for back pain and arthralgias.  Skin: Negative for rash.  Neurological: Negative for dizziness, syncope, speech difficulty, weakness, light-headedness, numbness and headaches.  Hematological: Negative for adenopathy. Does not bruise/bleed easily.  Psychiatric/Behavioral: Negative for suicidal ideas, behavioral problems, self-injury, dysphoric mood and agitation. The patient is not nervous/anxious.        Objective:   Physical Exam  Constitutional: She is oriented to person, place, and time. She appears well-developed and well-nourished.  HENT:  Head: Normocephalic and atraumatic.  Right Ear: External ear normal.  Left Ear: External ear normal.  Mouth/Throat: Oropharynx is clear and moist.  Eyes: Conjunctivae normal and EOM are normal.  Neck: Normal range of motion. Neck supple. No JVD present. No thyromegaly present.       Left supraclavicular bruit  Cardiovascular: Normal rate, regular rhythm, normal heart sounds and intact distal pulses.   No murmur heard. Pulmonary/Chest:  Effort normal. She has wheezes. She has no rales.  Abdominal: Soft. Bowel sounds are normal. She exhibits no distension and no mass. There is no tenderness. There is no rebound and no guarding.  Genitourinary: Vagina normal.  Musculoskeletal: Normal range of motion. She exhibits no edema and no tenderness.  Neurological: She is alert and oriented to person, place, and time. She has normal reflexes. No cranial nerve deficit. She exhibits normal muscle tone. Coordination normal.  Skin: Skin is warm and dry. No rash noted.  Psychiatric: She has a normal mood and affect. Her behavior is normal.          Assessment & Plan:  Preventive health examination Diabetes. We'll check a hemoglobin A1c Hypertension stable Dyslipidemia. Continue atorvastatin COPD. We'll continue a maintenance medication. The patient was asked to take albuterol every 6 hours as needed for wheezing and to add an expectorant  Recheck 3 months

## 2012-07-30 NOTE — Progress Notes (Deleted)
Patient ID: Leslie [name], female   DOB: 07-Jan-1933, 78 y.o.   MRN: [id number]

## 2012-07-30 NOTE — Patient Instructions (Signed)
Get plenty of rest, Drink lots of  clear liquids, and use Tylenol or ibuprofen for fever and discomfort.    Use Proair every 6 hours as needed for wheezing   Please check your hemoglobin A1c every 3 months  Limit your sodium (Salt) intake

## 2012-07-31 ENCOUNTER — Ambulatory Visit: Payer: Medicare Other | Admitting: Internal Medicine

## 2012-08-19 ENCOUNTER — Ambulatory Visit (INDEPENDENT_AMBULATORY_CARE_PROVIDER_SITE_OTHER): Payer: Medicare Other | Admitting: Internal Medicine

## 2012-08-19 ENCOUNTER — Encounter: Payer: Self-pay | Admitting: Internal Medicine

## 2012-08-19 VITALS — BP 100/50 | HR 64 | Ht 63.5 in | Wt 193.4 lb

## 2012-08-19 DIAGNOSIS — K589 Irritable bowel syndrome without diarrhea: Secondary | ICD-10-CM

## 2012-08-19 DIAGNOSIS — K219 Gastro-esophageal reflux disease without esophagitis: Secondary | ICD-10-CM

## 2012-08-19 MED ORDER — DICYCLOMINE HCL 10 MG PO CAPS: 10.0000 mg | ORAL_CAPSULE | Freq: Three times a day (TID) | ORAL | Status: DC

## 2012-08-19 MED ORDER — OMEPRAZOLE 40 MG PO CPDR: 40.0000 mg | DELAYED_RELEASE_CAPSULE | Freq: Two times a day (BID) | ORAL | Status: DC

## 2012-08-19 NOTE — Progress Notes (Signed)
Leslie [name] [birth year] MRN [id number]  History of Present Illness:  This is a 78 year old white female with gastroesophageal reflux disease and irritable bowel syndrome. Her last appointment was in September 2013. We have increased her Prilosec to 40 mg in the morning and she is also taking Pepcid 40 mg at bedtime with complete control of the reflux. She was having diarrhea and loose stools which are now back to normal after taking Bentyl 20 mg daily. She has a dry mouth secondary to Bentyl. She is satisfied with the results. 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 Her last colonoscopy 2008 showed diverticulosis of the left colon. She has a history of a GI bleed due to 2 AVMs. This was confirmed on a small bowel capsule endoscopy.    Past Medical History  Diagnosis Date  . ALLERGIC RHINITIS, SEASONAL 05/06/2007  . ANEMIA NEC 05/20/2007  . ARTERIOVENOUS MALFORMATION 09/20/2008  . BACK PAIN, LUMBAR 09/21/2008  . CARDIOMYOPATHY, SECONDARY 11/21/2008  . DEGENERATIVE JOINT DISEASE, CERVICAL SPINE 05/06/2007  . DEPRESSION 10/31/2009  . DIABETES MELLITUS, TYPE II 05/06/2007  . DIVERTICULOSIS OF COLON 09/20/2008  . G I BLEED 10/14/2007  . GERD 05/06/2007  . HIATAL HERNIA 09/20/2008  . HYPERLIPIDEMIA 05/06/2007  . HYPERTENSION 05/06/2007  . MYOCARDIAL INFARCTION, HX OF 05/06/2007  . NEPHROLITHIASIS, HX OF 07/14/2007  . OSTEOARTHRITIS 05/06/2007  . PHLEBITIS&THROMBOPHLEB SUP VEINS UPPER EXTREM 07/25/2009  . TOBACCO USE, QUIT 07/20/2009  . Blood type O+    Past Surgical History  Procedure Date  . Abdominal hysterectomy   . Appendectomy   . Cholecystectomy   . Kidney stone surgery     x 2  . Knee arthroscopy     left  . Cardiac catheterization   . Cervical disc surgery     reports that she quit smoking about 28 years ago. Her smoking use included Cigarettes. She has a 30 pack-year smoking history. She has never used smokeless tobacco. She reports that she drinks alcohol. She reports that  she does not use illicit drugs. family history includes Breast cancer in her sister and unspecified family member; Diabetes in her sister and unspecified family member; Heart attack in her brother and father; Heart disease in her mother and sister; and Heart failure in her mother. Allergies  Allergen Reactions  . Ivp Dye (Iodinated Diagnostic Agents) Anaphylaxis  . Buprenorphine Hcl   . Macrolides And Ketolides   . Penicillins Hives  . Streptomycin   . Sulfonamide Derivatives Hives        Review of Systems: Denies dysphagia heartburn. Has occasional globus sensation in the back of her throat and dry mouth  The remainder of the 10 point ROS is negative except as outlined in H&P   Physical Exam: General appearance  Well developed, in no distress. Eyes- non icteric. HEENT nontraumatic, normocephalic. Mouth no lesions, tongue papillated, no cheilosis. Neck supple without adenopathy, thyroid not enlarged, no carotid bruits, no JVD. Lungs Clear to auscultation bilaterally. Cor normal S1, normal S2, regular rhythm, no murmur,  quiet precordium. Abdomen: Soft, nontender. Rectal: Not done. Extremities no pedal edema. Skin no lesions. Neurological alert and oriented x 3. Psychological normal mood and affect.  Assessment and Plan:  Problem #1 Gastroesophageal reflux disease currently under good control with Prilosec 40 mg in the morning and Pepcid 20 mg at bedtime. She will continue the same regimen.  Problem #2 Irritable bowel syndrome with diarrhea, well-controlled with Bentyl. We will decrease the strength of the Bentyl  to 10 mg daily. She will return on a when necessary basis.   08/19/2012 [name]

## 2012-08-19 NOTE — Patient Instructions (Addendum)
We have sent the following medications to your pharmacy for you to pick up at your convenience: Bentyl Prilosec CC: Dr Eleonore Chiquito

## 2012-08-31 ENCOUNTER — Other Ambulatory Visit: Payer: Self-pay | Admitting: Internal Medicine

## 2012-08-31 DIAGNOSIS — Z1231 Encounter for screening mammogram for malignant neoplasm of breast: Secondary | ICD-10-CM

## 2012-09-07 ENCOUNTER — Other Ambulatory Visit: Payer: Self-pay | Admitting: *Deleted

## 2012-09-07 MED ORDER — DICYCLOMINE HCL 10 MG PO CAPS: 10.0000 mg | ORAL_CAPSULE | Freq: Four times a day (QID) | ORAL | Status: DC | PRN

## 2012-10-07 ENCOUNTER — Ambulatory Visit
Admission: RE | Admit: 2012-10-07 | Discharge: 2012-10-07 | Disposition: A | Discharge status: Home / Self Care | Payer: Medicare Other | Source: Ambulatory Visit | Attending: Internal Medicine | Admitting: Internal Medicine

## 2012-10-07 DIAGNOSIS — Z1231 Encounter for screening mammogram for malignant neoplasm of breast: Secondary | ICD-10-CM

## 2012-10-19 ENCOUNTER — Telehealth: Payer: Self-pay | Admitting: Internal Medicine

## 2012-10-19 MED ORDER — TRAZODONE HCL 100 MG PO TABS: 100.0000 mg | ORAL_TABLET | Freq: Every day | ORAL | Status: DC

## 2012-10-19 MED ORDER — OLMESARTAN MEDOXOMIL-HCTZ 40-12.5 MG PO TABS: 1.0000 | ORAL_TABLET | Freq: Every day | ORAL | Status: DC

## 2012-10-19 NOTE — Telephone Encounter (Signed)
Spoke to pt told her refilled her Benicar and called in Trazodone to pharmacy. Pt verbalized understanding.

## 2012-10-19 NOTE — Telephone Encounter (Signed)
Called regarding 90 day refills of Trazodone and Benecar.  Reqeusted [facility], 10/15/12 and 10/19/12. Has one Trazodone dose remaining; vial says not to skip doses or discontinue without MD order.  Still has some Benecar remaining.  Has appointment on 10/21/12 at 1245.  [facility] [address] Please call her on cell [phone] to advise when Trazodone has been refilled.

## 2012-10-21 ENCOUNTER — Ambulatory Visit: Payer: Medicare Other | Admitting: Internal Medicine

## 2012-10-27 ENCOUNTER — Telehealth: Payer: Self-pay | Admitting: Internal Medicine

## 2012-10-27 MED ORDER — ALBUTEROL SULFATE HFA 108 (90 BASE) MCG/ACT IN AERS: 2.0000 | INHALATION_SPRAY | Freq: Four times a day (QID) | RESPIRATORY_TRACT | Status: AC | PRN

## 2012-10-27 MED ORDER — MOMETASONE FURO-FORMOTEROL FUM 100-5 MCG/ACT IN AERO: INHALATION_SPRAY | RESPIRATORY_TRACT | Status: AC

## 2012-10-27 NOTE — Telephone Encounter (Signed)
PT informed that refill for Leslie [name] was sent to pharmacy.

## 2012-10-27 NOTE — Telephone Encounter (Addendum)
Pt left meds in [place]. Pt needs new rx pro-air [facility] [place] [phone]. Pt stated dulera was prescribed by Dr Sherene Sires . The pt was inform to call Dr Sherene Sires office

## 2012-10-27 NOTE — Telephone Encounter (Signed)
Left message on voicemail Rx was called into pharmacy. 

## 2012-11-16 ENCOUNTER — Encounter: Payer: Self-pay | Admitting: Internal Medicine

## 2012-11-16 ENCOUNTER — Ambulatory Visit (INDEPENDENT_AMBULATORY_CARE_PROVIDER_SITE_OTHER): Payer: Medicare Other | Admitting: Internal Medicine

## 2012-11-16 VITALS — BP 130/74 | HR 63 | Temp 98.0°F | Resp 18 | Wt 198.0 lb

## 2012-11-16 DIAGNOSIS — I10 Essential (primary) hypertension: Secondary | ICD-10-CM

## 2012-11-16 DIAGNOSIS — E119 Type 2 diabetes mellitus without complications: Secondary | ICD-10-CM

## 2012-11-16 DIAGNOSIS — E785 Hyperlipidemia, unspecified: Secondary | ICD-10-CM

## 2012-11-16 NOTE — Progress Notes (Signed)
Subjective:    Patient ID: Leslie [name], female    DOB: 1933-01-04, 79 y.o.   MRN: [id number]  HPI  79 year old patient who is seen today for her quarterly followup. She has a history of diabetes controlled on submaximal dose of metformin. There is been some weight gain and lack of activity over the winter. She has treated hypertension and dyslipidemia in general she is doing quite well. She plans on initiating an exercise program starting next month. She has a history of cardiomyopathy coronary artery disease as well as carotid artery stenosis. She denies any exertional chest pain or any focal neurological symptoms.  Past Medical History  Diagnosis Date  . ALLERGIC RHINITIS, SEASONAL 05/06/2007  . ANEMIA NEC 05/20/2007  . ARTERIOVENOUS MALFORMATION 09/20/2008  . BACK PAIN, LUMBAR 09/21/2008  . CARDIOMYOPATHY, SECONDARY 11/21/2008  . DEGENERATIVE JOINT DISEASE, CERVICAL SPINE 05/06/2007  . DEPRESSION 10/31/2009  . DIABETES MELLITUS, TYPE II 05/06/2007  . DIVERTICULOSIS OF COLON 09/20/2008  . G I BLEED 10/14/2007  . GERD 05/06/2007  . HIATAL HERNIA 09/20/2008  . HYPERLIPIDEMIA 05/06/2007  . HYPERTENSION 05/06/2007  . MYOCARDIAL INFARCTION, HX OF 05/06/2007  . NEPHROLITHIASIS, HX OF 07/14/2007  . OSTEOARTHRITIS 05/06/2007  . PHLEBITIS&THROMBOPHLEB SUP VEINS UPPER EXTREM 07/25/2009  . TOBACCO USE, QUIT 07/20/2009  . Blood type O+     History   Social History  . Marital Status: Widowed    Spouse Name: N/A    Number of Children: 0  . Years of Education: N/A   Occupational History  . Not on file.   Social History Main Topics  . Smoking status: Former Smoker -- 1.00 packs/day for 30 years    Types: Cigarettes    Quit date: 07/15/1984  . Smokeless tobacco: Never Used  . Alcohol Use: Yes     Comment: 2-3 glasses of wine  . Drug Use: No  . Sexually Active: Not on file   Other Topics Concern  . Not on file   Social History Narrative  . No narrative on file    Past Surgical History   Procedure Laterality Date  . Abdominal hysterectomy    . Appendectomy    . Cholecystectomy    . Kidney stone surgery      x 2  . Knee arthroscopy      left  . Cardiac catheterization    . Cervical disc surgery      Family History  Problem Relation Age of Onset  . Heart disease Mother   . Heart failure Mother   . Heart attack Father   . Heart attack Brother   . Heart disease Sister     x 2  . Diabetes Sister   . Diabetes      aunts x 2  . Breast cancer Sister   . Breast cancer      Allergies  Allergen Reactions  . Ivp Dye (Iodinated Diagnostic Agents) Anaphylaxis  . Buprenorphine Hcl   . Macrolides And Ketolides   . Penicillins Hives  . Streptomycin   . Sulfonamide Derivatives Hives    Current Outpatient Prescriptions on File Prior to Visit  Medication Sig Dispense Refill  . albuterol (PROVENTIL HFA;VENTOLIN HFA) 108 (90 BASE) MCG/ACT inhaler Inhale 2 puffs into the lungs every 6 (six) hours as needed for wheezing.  1 Inhaler  0  . amLODipine (NORVASC) 5 MG tablet Take 1 tablet (5 mg total) by mouth daily.  90 tablet  1  . aspirin 81 MG tablet Take 81  mg by mouth daily.        Marland Kitchen atorvastatin (LIPITOR) 20 MG tablet Take 1 tablet (20 mg total) by mouth daily.  90 tablet  2  . calcium gluconate 500 MG tablet Take 500 mg by mouth 2 (two) times daily.       . chlorpheniramine (CHLOR-TRIMETON) 4 MG tablet Take 8 mg by mouth at bedtime.      . Cholecalciferol (VITAMIN D) 400 UNITS capsule Take 400 Units by mouth daily.        . citalopram (CELEXA) 40 MG tablet       . dicyclomine (BENTYL) 10 MG capsule Take 1 capsule (10 mg total) by mouth 4 (four) times daily as needed.  90 capsule  1  . famotidine (PEPCID) 20 MG tablet Take 20 mg by mouth at bedtime.      . ferrous sulfate 325 (65 FE) MG EC tablet Take 325 mg by mouth daily with breakfast.        . Glucosamine-Chondroitin (COSAMIN DS PO) Take 1 tablet by mouth at bedtime.        Marland Kitchen HYDROcodone-acetaminophen (NORCO)  7.5-325 MG per tablet Take 1 tablet by mouth every 6 (six) hours as needed. For pain       . ibuprofen (ADVIL,MOTRIN) 200 MG tablet Take 200 mg by mouth every 6 (six) hours as needed.      . metFORMIN (GLUCOPHAGE-XR) 500 MG 24 hr tablet TAKE 2 TABLETS BY MOUTH AT BEDTIME  180 tablet  3  . mometasone-formoterol (DULERA) 100-5 MCG/ACT AERO 2 puffs each am  1 Inhaler  6  . Multiple Vitamins-Minerals (CENTRUM ULTRA WOMENS) TABS Take 1 tablet by mouth daily.      . nebivolol (BYSTOLIC) 10 MG tablet Take 1 tablet (10 mg total) by mouth daily.  90 tablet  6  . olmesartan-hydrochlorothiazide (BENICAR HCT) 40-12.5 MG per tablet Take 1 tablet by mouth daily.  90 tablet  1  . omeprazole (PRILOSEC) 40 MG capsule Take 1 capsule (40 mg total) by mouth 2 (two) times daily.  180 capsule  0  . traZODone (DESYREL) 100 MG tablet Take 1 tablet (100 mg total) by mouth at bedtime.  90 tablet  1  . zolpidem (AMBIEN) 5 MG tablet Take 5 mg by mouth at bedtime as needed.       No current facility-administered medications on file prior to visit.    BP 130/74  Pulse 63  Temp(Src) 98 F (36.7 C) (Oral)  Resp 18  Wt 198 lb (89.812 kg)  BMI 34.52 kg/m2  SpO2 95%       Review of Systems  Constitutional: Negative.   HENT: Negative for hearing loss, congestion, sore throat, rhinorrhea, dental problem, sinus pressure and tinnitus.   Eyes: Negative for pain, discharge and visual disturbance.  Respiratory: Negative for cough and shortness of breath.   Cardiovascular: Negative for chest pain, palpitations and leg swelling.  Gastrointestinal: Negative for nausea, vomiting, abdominal pain, diarrhea, constipation, blood in stool and abdominal distention.  Genitourinary: Negative for dysuria, urgency, frequency, hematuria, flank pain, vaginal bleeding, vaginal discharge, difficulty urinating, vaginal pain and pelvic pain.  Musculoskeletal: Negative for joint swelling, arthralgias and gait problem.  Skin: Negative for  rash.  Neurological: Negative for dizziness, syncope, speech difficulty, weakness, numbness and headaches.  Hematological: Negative for adenopathy.  Psychiatric/Behavioral: Negative for behavioral problems, dysphoric mood and agitation. The patient is not nervous/anxious.        Objective:   Physical Exam  Constitutional:  She is oriented to person, place, and time. She appears well-developed and well-nourished.  HENT:  Head: Normocephalic.  Right Ear: External ear normal.  Left Ear: External ear normal.  Mouth/Throat: Oropharynx is clear and moist.  Eyes: Conjunctivae and EOM are normal. Pupils are equal, round, and reactive to light.  Neck: Normal range of motion. Neck supple. No thyromegaly present.  Cardiovascular: Normal rate, regular rhythm, normal heart sounds and intact distal pulses.   Pulmonary/Chest: Effort normal and breath sounds normal.  Abdominal: Soft. Bowel sounds are normal. She exhibits no mass. There is no tenderness.  Musculoskeletal: Normal range of motion.  Lymphadenopathy:    She has no cervical adenopathy.  Neurological: She is alert and oriented to person, place, and time.  Skin: Skin is warm and dry. No rash noted.  Psychiatric: She has a normal mood and affect. Her behavior is normal.          Assessment & Plan:   Hypertension stable Diabetes mellitus. Will check a hemoglobin A1c. Regular exercise weight loss better eating habits all encouraged we'll reassess in 3 months Dyslipidemia Coronary artery disease stable   Recheck 3 months

## 2012-11-16 NOTE — Patient Instructions (Signed)
It is important that you exercise regularly, at least 20 minutes 3 to 4 times per week.  If you develop chest pain or shortness of breath seek  medical attention.   Please check your hemoglobin A1c every 3 months  Limit your sodium (Salt) intake  Avoids foods high in acid such as tomatoes citrus juices, and spicy foods.  Avoid eating within two hours of lying down or before exercising.  Do not overheat.  Try smaller more frequent meals.  If symptoms persist, elevate the head of her bed 12 inches while sleeping.

## 2012-11-19 ENCOUNTER — Other Ambulatory Visit: Payer: Self-pay | Admitting: Internal Medicine

## 2012-11-19 NOTE — Telephone Encounter (Signed)
Patient just called. States the Celexa was to be refilled when she was here [place], but it was not done by Dr. Kirtland Bouchard. Now she is out of meds. Asking we approve refill today. [facility] [address]

## 2012-12-11 ENCOUNTER — Other Ambulatory Visit: Payer: Self-pay | Admitting: Internal Medicine

## 2013-02-12 ENCOUNTER — Ambulatory Visit: Payer: Medicare Other | Admitting: Internal Medicine

## 2013-02-15 ENCOUNTER — Ambulatory Visit (INDEPENDENT_AMBULATORY_CARE_PROVIDER_SITE_OTHER): Payer: Medicare Other | Admitting: Internal Medicine

## 2013-02-15 ENCOUNTER — Other Ambulatory Visit: Payer: Self-pay | Admitting: *Deleted

## 2013-02-15 ENCOUNTER — Encounter: Payer: Self-pay | Admitting: Internal Medicine

## 2013-02-15 VITALS — BP 122/70 | HR 68 | Temp 98.1°F | Resp 20 | Wt 193.0 lb

## 2013-02-15 DIAGNOSIS — M479 Spondylosis, unspecified: Secondary | ICD-10-CM

## 2013-02-15 DIAGNOSIS — I10 Essential (primary) hypertension: Secondary | ICD-10-CM

## 2013-02-15 DIAGNOSIS — E119 Type 2 diabetes mellitus without complications: Secondary | ICD-10-CM

## 2013-02-15 DIAGNOSIS — E785 Hyperlipidemia, unspecified: Secondary | ICD-10-CM

## 2013-02-15 LAB — HEMOGLOBIN A1C: Hgb A1c MFr Bld: 7.2 % — ABNORMAL HIGH (ref 4.6–6.5)

## 2013-02-15 MED ORDER — GLUCOSE BLOOD VI STRP: 1.0000 | ORAL_STRIP | Freq: Every day | Status: AC | PRN

## 2013-02-15 MED ORDER — FREESTYLE LANCETS MISC: 1.0000 | Freq: Every day | Status: AC | PRN

## 2013-02-15 MED ORDER — NEBIVOLOL HCL 10 MG PO TABS: ORAL_TABLET | ORAL | Status: DC

## 2013-02-15 NOTE — Progress Notes (Signed)
Subjective:    Patient ID: Leslie [name], female    DOB: September 05, 1933, 79 y.o.   MRN: [id number]  HPI  79 year old patient who is seen today for her quarterly followup. She has type 2 diabetes which has been controlled on oral medication. Her last 2 hemoglobin A1c is have trended up and were 7.0. She has a history of coronary artery disease prior MI and secondary heart myopathy. She has carotid artery stenosis. She denies any cardiac or focal neurological symptoms. She has treated hypertension and osteoarthritis. Only complaint today is back and right knee pain. Right knee pain has been present for about 2-1/2 weeks. She feels that she may have strained her knee while participating in the [place] Olympics.  Past Medical History  Diagnosis Date  . ALLERGIC RHINITIS, SEASONAL 05/06/2007  . ANEMIA NEC 05/20/2007  . ARTERIOVENOUS MALFORMATION 09/20/2008  . BACK PAIN, LUMBAR 09/21/2008  . CARDIOMYOPATHY, SECONDARY 11/21/2008  . DEGENERATIVE JOINT DISEASE, CERVICAL SPINE 05/06/2007  . DEPRESSION 10/31/2009  . DIABETES MELLITUS, TYPE II 05/06/2007  . DIVERTICULOSIS OF COLON 09/20/2008  . G I BLEED 10/14/2007  . GERD 05/06/2007  . HIATAL HERNIA 09/20/2008  . HYPERLIPIDEMIA 05/06/2007  . HYPERTENSION 05/06/2007  . MYOCARDIAL INFARCTION, HX OF 05/06/2007  . NEPHROLITHIASIS, HX OF 07/14/2007  . OSTEOARTHRITIS 05/06/2007  . PHLEBITIS&THROMBOPHLEB SUP VEINS UPPER EXTREM 07/25/2009  . TOBACCO USE, QUIT 07/20/2009  . Blood type O+     History   Social History  . Marital Status: Widowed    Spouse Name: N/A    Number of Children: 0  . Years of Education: N/A   Occupational History  . Not on file.   Social History Main Topics  . Smoking status: Former Smoker -- 1.00 packs/day for 30 years    Types: Cigarettes    Quit date: 07/15/1984  . Smokeless tobacco: Never Used  . Alcohol Use: Yes     Comment: 2-3 glasses of wine  . Drug Use: No  . Sexually Active: Not on file   Other Topics Concern  . Not on file    Social History Narrative  . No narrative on file    Past Surgical History  Procedure Laterality Date  . Abdominal hysterectomy    . Appendectomy    . Cholecystectomy    . Kidney stone surgery      x 2  . Knee arthroscopy      left  . Cardiac catheterization    . Cervical disc surgery      Family History  Problem Relation Age of Onset  . Heart disease Mother   . Heart failure Mother   . Heart attack Father   . Heart attack Brother   . Heart disease Sister     x 2  . Diabetes Sister   . Diabetes      aunts x 2  . Breast cancer Sister   . Breast cancer      Allergies  Allergen Reactions  . Ivp Dye (Iodinated Diagnostic Agents) Anaphylaxis  . Buprenorphine Hcl   . Macrolides And Ketolides   . Penicillins Hives  . Streptomycin   . Sulfonamide Derivatives Hives    Current Outpatient Prescriptions on File Prior to Visit  Medication Sig Dispense Refill  . albuterol (PROVENTIL HFA;VENTOLIN HFA) 108 (90 BASE) MCG/ACT inhaler Inhale 2 puffs into the lungs every 6 (six) hours as needed for wheezing.  1 Inhaler  0  . amLODipine (NORVASC) 5 MG tablet TAKE 1 TABLET (5 MG  TOTAL) BY MOUTH DAILY.  90 tablet  3  . aspirin 81 MG tablet Take 81 mg by mouth daily.        Marland Kitchen atorvastatin (LIPITOR) 20 MG tablet Take 1 tablet (20 mg total) by mouth daily.  90 tablet  2  . calcium gluconate 500 MG tablet Take 500 mg by mouth 2 (two) times daily.       . Cholecalciferol (VITAMIN D) 400 UNITS capsule Take 400 Units by mouth daily.        . citalopram (CELEXA) 40 MG tablet TAKE 1 TABLET BY MOUTH EVERY DAY  90 tablet  3  . ferrous sulfate 325 (65 FE) MG EC tablet Take 325 mg by mouth daily with breakfast.        . Glucosamine-Chondroitin (COSAMIN DS PO) Take 1 tablet by mouth at bedtime.        Marland Kitchen HYDROcodone-acetaminophen (NORCO) 7.5-325 MG per tablet Take 1 tablet by mouth every 6 (six) hours as needed. For pain       . ibuprofen (ADVIL,MOTRIN) 200 MG tablet Take 200 mg by mouth every 6  (six) hours as needed.      . metFORMIN (GLUCOPHAGE-XR) 500 MG 24 hr tablet TAKE 2 TABLETS BY MOUTH AT BEDTIME  180 tablet  3  . mometasone-formoterol (DULERA) 100-5 MCG/ACT AERO 2 puffs each am  1 Inhaler  6  . Multiple Vitamins-Minerals (CENTRUM ULTRA WOMENS) TABS Take 1 tablet by mouth daily.      Marland Kitchen olmesartan-hydrochlorothiazide (BENICAR HCT) 40-12.5 MG per tablet Take 1 tablet by mouth daily.  90 tablet  1  . traZODone (DESYREL) 100 MG tablet Take 1 tablet (100 mg total) by mouth at bedtime.  90 tablet  1  . zolpidem (AMBIEN) 5 MG tablet Take 5 mg by mouth at bedtime as needed.      . nebivolol (BYSTOLIC) 10 MG tablet Take 1 tablet (10 mg total) by mouth daily.  90 tablet  6   No current facility-administered medications on file prior to visit.    BP 122/70  Pulse 68  Temp(Src) 98.1 F (36.7 C) (Oral)  Resp 20  Wt 193 lb (87.544 kg)  BMI 33.65 kg/m2  SpO2 95%      Review of Systems  Constitutional: Negative.   HENT: Negative for hearing loss, congestion, sore throat, rhinorrhea, dental problem, sinus pressure and tinnitus.   Eyes: Negative for pain, discharge and visual disturbance.  Respiratory: Negative for cough and shortness of breath.   Cardiovascular: Negative for chest pain, palpitations and leg swelling.  Gastrointestinal: Negative for nausea, vomiting, abdominal pain, diarrhea, constipation, blood in stool and abdominal distention.  Genitourinary: Negative for dysuria, urgency, frequency, hematuria, flank pain, vaginal bleeding, vaginal discharge, difficulty urinating, vaginal pain and pelvic pain.  Musculoskeletal: Positive for back pain and joint swelling. Negative for arthralgias and gait problem.  Skin: Negative for rash.  Neurological: Negative for dizziness, syncope, speech difficulty, weakness, numbness and headaches.  Hematological: Negative for adenopathy.  Psychiatric/Behavioral: Negative for behavioral problems, dysphoric mood and agitation. The patient  is not nervous/anxious.        Objective:   Physical Exam  Constitutional: She is oriented to person, place, and time. She appears well-developed and well-nourished.  HENT:  Head: Normocephalic.  Right Ear: External ear normal.  Left Ear: External ear normal.  Mouth/Throat: Oropharynx is clear and moist.  Eyes: Conjunctivae and EOM are normal. Pupils are equal, round, and reactive to light.  Neck: Normal range of motion.  Neck supple. No thyromegaly present.  Cardiovascular: Normal rate, regular rhythm, normal heart sounds and intact distal pulses.   Pulmonary/Chest: Effort normal and breath sounds normal.  Abdominal: Soft. Bowel sounds are normal. She exhibits no mass. There is no tenderness.  Musculoskeletal: Normal range of motion.  The right knee was not warm to touch;  she did have some medial tenderness along the joint line  Lymphadenopathy:    She has no cervical adenopathy.  Neurological: She is alert and oriented to person, place, and time.  Skin: Skin is warm and dry. No rash noted.  Psychiatric: She has a normal mood and affect. Her behavior is normal.          Assessment & Plan:   Right knee pain. Probable strain. Patient does have a history of osteoarthritis we'll continue when necessary ibuprofen and analgesics at this time. We'll consider further evaluation if persists Diabetes mellitus. Will check a hemoglobin A1c Hypertension well controlled  Osteoarthritis CAD stable

## 2013-02-15 NOTE — Progress Notes (Signed)
  Subjective:    Patient ID: Leslie [name], female    DOB: 1932/12/16, 79 y.o.   MRN: [id number]  HPI   Wt Readings from Last 3 Encounters:  02/15/13 193 lb (87.544 kg)  11/16/12 198 lb (89.812 kg)  08/19/12 193 lb 6 oz (87.714 kg)   Review of Systems     Objective:   Physical Exam        Assessment & Plan:

## 2013-02-15 NOTE — Patient Instructions (Signed)
Limit your sodium (Salt) intake   Please check your hemoglobin A1c every 3 months    It is important that you exercise regularly, at least 20 minutes 3 to 4 times per week.  If you develop chest pain or shortness of breath seek  medical attention.   

## 2013-03-16 ENCOUNTER — Other Ambulatory Visit: Payer: Self-pay | Admitting: Internal Medicine

## 2013-04-13 ENCOUNTER — Other Ambulatory Visit: Payer: Self-pay | Admitting: Internal Medicine

## 2013-05-11 ENCOUNTER — Other Ambulatory Visit: Payer: Medicare Other

## 2013-05-18 ENCOUNTER — Encounter: Payer: Medicare Other | Admitting: Internal Medicine

## 2013-05-18 ENCOUNTER — Other Ambulatory Visit (INDEPENDENT_AMBULATORY_CARE_PROVIDER_SITE_OTHER): Payer: Medicare Other

## 2013-05-18 ENCOUNTER — Other Ambulatory Visit: Payer: Medicare Other

## 2013-05-18 LAB — HEMOGLOBIN A1C: Hgb A1c MFr Bld: 7.2 % — ABNORMAL HIGH (ref 4.6–6.5)

## 2013-05-25 ENCOUNTER — Encounter: Payer: Medicare Other | Admitting: Internal Medicine

## 2013-07-29 ENCOUNTER — Other Ambulatory Visit: Payer: Self-pay | Admitting: Internal Medicine

## 2013-08-02 ENCOUNTER — Other Ambulatory Visit (INDEPENDENT_AMBULATORY_CARE_PROVIDER_SITE_OTHER): Payer: Medicare Other

## 2013-08-02 ENCOUNTER — Ambulatory Visit (INDEPENDENT_AMBULATORY_CARE_PROVIDER_SITE_OTHER): Payer: Medicare Other | Admitting: *Deleted

## 2013-08-02 DIAGNOSIS — E785 Hyperlipidemia, unspecified: Secondary | ICD-10-CM

## 2013-08-02 DIAGNOSIS — Z23 Encounter for immunization: Secondary | ICD-10-CM

## 2013-08-02 DIAGNOSIS — Z0000 Encounter for general adult medical examination without abnormal findings: Secondary | ICD-10-CM

## 2013-08-02 DIAGNOSIS — E119 Type 2 diabetes mellitus without complications: Secondary | ICD-10-CM

## 2013-08-02 LAB — LIPID PANEL
Cholesterol: 147 mg/dL (ref 0–200)
LDL Cholesterol: 67 mg/dL (ref 0–99)
Triglycerides: 138 mg/dL (ref 0.0–149.0)

## 2013-08-02 LAB — HEPATIC FUNCTION PANEL
AST: 16 U/L (ref 0–37)
Albumin: 3.9 g/dL (ref 3.5–5.2)
Alkaline Phosphatase: 76 U/L (ref 39–117)
Bilirubin, Direct: 0 mg/dL (ref 0.0–0.3)
Total Bilirubin: 0.5 mg/dL (ref 0.3–1.2)

## 2013-08-02 LAB — CBC WITH DIFFERENTIAL/PLATELET
Basophils Absolute: 0 10*3/uL (ref 0.0–0.1)
Hemoglobin: 12.6 g/dL (ref 12.0–15.0)
Lymphocytes Relative: 21.9 % (ref 12.0–46.0)
Lymphs Abs: 1.6 10*3/uL (ref 0.7–4.0)
MCV: 93.9 fl (ref 78.0–100.0)
Monocytes Relative: 9.1 % (ref 3.0–12.0)
Neutro Abs: 4.7 10*3/uL (ref 1.4–7.7)
Neutrophils Relative %: 64.4 % (ref 43.0–77.0)
Platelets: 249 10*3/uL (ref 150.0–400.0)
RBC: 3.98 Mil/uL (ref 3.87–5.11)
RDW: 13.7 % (ref 11.5–14.6)

## 2013-08-02 LAB — BASIC METABOLIC PANEL
Calcium: 9.1 mg/dL (ref 8.4–10.5)
Chloride: 107 mEq/L (ref 96–112)
Creatinine, Ser: 1.1 mg/dL (ref 0.4–1.2)
GFR: 49.43 mL/min — ABNORMAL LOW (ref >60.00)
Glucose, Bld: 142 mg/dL — ABNORMAL HIGH (ref 70–99)
Sodium: 142 mEq/L (ref 135–145)

## 2013-08-02 LAB — POCT URINALYSIS DIPSTICK
Bilirubin, UA: NEGATIVE
Glucose, UA: NEGATIVE
Ketones, UA: NEGATIVE
Protein, UA: NEGATIVE
Spec Grav, UA: 1.02

## 2013-08-02 LAB — MICROALBUMIN / CREATININE URINE RATIO
Creatinine,U: 156.5 mg/dL
Microalb Creat Ratio: 0.8 mg/g (ref 0.0–30.0)
Microalb, Ur: 1.3 mg/dL (ref 0.0–1.9)

## 2013-08-09 ENCOUNTER — Ambulatory Visit (INDEPENDENT_AMBULATORY_CARE_PROVIDER_SITE_OTHER): Payer: Medicare Other | Admitting: Internal Medicine

## 2013-08-09 ENCOUNTER — Encounter: Payer: Self-pay | Admitting: Internal Medicine

## 2013-08-09 ENCOUNTER — Other Ambulatory Visit: Payer: Self-pay | Admitting: Internal Medicine

## 2013-08-09 VITALS — BP 124/80 | HR 64 | Temp 98.0°F | Resp 20 | Ht 63.5 in | Wt 196.0 lb

## 2013-08-09 DIAGNOSIS — I252 Old myocardial infarction: Secondary | ICD-10-CM

## 2013-08-09 DIAGNOSIS — E119 Type 2 diabetes mellitus without complications: Secondary | ICD-10-CM

## 2013-08-09 DIAGNOSIS — I10 Essential (primary) hypertension: Secondary | ICD-10-CM

## 2013-08-09 DIAGNOSIS — D6489 Other specified anemias: Secondary | ICD-10-CM

## 2013-08-09 DIAGNOSIS — E785 Hyperlipidemia, unspecified: Secondary | ICD-10-CM

## 2013-08-09 DIAGNOSIS — J449 Chronic obstructive pulmonary disease, unspecified: Secondary | ICD-10-CM

## 2013-08-09 DIAGNOSIS — M1990 Unspecified osteoarthritis, unspecified site: Secondary | ICD-10-CM

## 2013-08-09 DIAGNOSIS — Z0000 Encounter for general adult medical examination without abnormal findings: Secondary | ICD-10-CM

## 2013-08-09 DIAGNOSIS — I6529 Occlusion and stenosis of unspecified carotid artery: Secondary | ICD-10-CM

## 2013-08-09 DIAGNOSIS — J4489 Other specified chronic obstructive pulmonary disease: Secondary | ICD-10-CM

## 2013-08-09 DIAGNOSIS — I429 Cardiomyopathy, unspecified: Secondary | ICD-10-CM

## 2013-08-09 MED ORDER — LISINOPRIL-HYDROCHLOROTHIAZIDE 20-25 MG PO TABS: 1.0000 | ORAL_TABLET | Freq: Every day | ORAL | Status: DC

## 2013-08-09 NOTE — Patient Instructions (Signed)
Limit your sodium (Salt) intake    It is important that you exercise regularly, at least 20 minutes 3 to 4 times per week.  If you develop chest pain or shortness of breath seek  medical attention.  You need to lose weight.  Consider a lower calorie diet and regular exercise.  Return in 6 months for follow-up   

## 2013-08-09 NOTE — Progress Notes (Signed)
Subjective:    Patient ID: Leslie [name], female    DOB: 05-Jan-1933, 79 y.o.   MRN: [id number]  HPI  Pre-visit discussion using our clinic review tool. No additional management support is needed unless otherwise documented below in the visit note. Wt Readings from Last 3 Encounters:  08/09/13 196 lb (88.905 kg)  02/15/13 193 lb (87.544 kg)  11/16/12 198 lb (89.70 kg)    43 -year-old patient who is seen today for a preventive health examination. She is followed by cardiology and pulmonary. Her cardiopulmonary status has been stable. For the past week she's had a URI with some increasing cough and wheezing. She's had a recent carotid duplex study (2013 mild disease). She has type 2 diabetes which has been stable. She receives annual gynecologic evaluations. She has treated hypertension type 2 diabetes as well as dyslipidemia  Past Medical History  Diagnosis Date  . ALLERGIC RHINITIS, SEASONAL 05/06/2007  . ANEMIA NEC 05/20/2007  . ARTERIOVENOUS MALFORMATION 09/20/2008  . BACK PAIN, LUMBAR 09/21/2008  . CARDIOMYOPATHY, SECONDARY 11/21/2008  . DEGENERATIVE JOINT DISEASE, CERVICAL SPINE 05/06/2007  . DEPRESSION 10/31/2009  . DIABETES MELLITUS, TYPE II 05/06/2007  . DIVERTICULOSIS OF COLON 09/20/2008  . G I BLEED 10/14/2007  . GERD 05/06/2007  . HIATAL HERNIA 09/20/2008  . HYPERLIPIDEMIA 05/06/2007  . HYPERTENSION 05/06/2007  . MYOCARDIAL INFARCTION, HX OF 05/06/2007  . NEPHROLITHIASIS, HX OF 07/14/2007  . OSTEOARTHRITIS 05/06/2007  . PHLEBITIS&THROMBOPHLEB SUP VEINS UPPER EXTREM 07/25/2009  . TOBACCO USE, QUIT 07/20/2009  . Blood type O+     History   Social History  . Marital Status: Widowed    Spouse Name: N/A    Number of Children: 0  . Years of Education: N/A   Occupational History  . Not on file.   Social History Main Topics  . Smoking status: Former Smoker -- 1.00 packs/day for 30 years    Types: Cigarettes    Quit date: 07/15/1984  . Smokeless tobacco: Never Used  . Alcohol Use:  Yes     Comment: 2-3 glasses of wine  . Drug Use: No  . Sexual Activity: Not on file   Other Topics Concern  . Not on file   Social History Narrative  . No narrative on file    Past Surgical History  Procedure Laterality Date  . Abdominal hysterectomy    . Appendectomy    . Cholecystectomy    . Kidney stone surgery      x 2  . Knee arthroscopy      left  . Cardiac catheterization    . Cervical disc surgery      Family History  Problem Relation Age of Onset  . Heart disease Mother   . Heart failure Mother   . Heart attack Father   . Heart attack Brother   . Heart disease Sister     x 2  . Diabetes Sister   . Diabetes      aunts x 2  . Breast cancer Sister   . Breast cancer      Allergies  Allergen Reactions  . Ivp Dye [Iodinated Diagnostic Agents] Anaphylaxis  . Buprenorphine Hcl   . Macrolides And Ketolides   . Penicillins Hives  . Streptomycin   . Sulfonamide Derivatives Hives    Current Outpatient Prescriptions on File Prior to Visit  Medication Sig Dispense Refill  . albuterol (PROVENTIL HFA;VENTOLIN HFA) 108 (90 BASE) MCG/ACT inhaler Inhale 2 puffs into the lungs every 6 (six) hours  as needed for wheezing.  1 Inhaler  0  . amLODipine (NORVASC) 5 MG tablet TAKE 1 TABLET (5 MG TOTAL) BY MOUTH DAILY.  90 tablet  3  . aspirin 81 MG tablet Take 81 mg by mouth daily.        Marland Kitchen atorvastatin (LIPITOR) 20 MG tablet TAKE 1 TABLET (20 MG TOTAL) BY MOUTH DAILY.  90 tablet  2  . BENICAR HCT 40-12.5 MG per tablet TAKE 1 TABLET BY MOUTH DAILY.  90 tablet  1  . calcium gluconate 500 MG tablet Take 500 mg by mouth 2 (two) times daily.       . Chlorpheniramine Maleate ([employer] ALLERGY RELIEF PO) Take 1 tablet by mouth daily.      . Cholecalciferol (VITAMIN D) 400 UNITS capsule Take 400 Units by mouth daily.        . citalopram (CELEXA) 40 MG tablet TAKE 1 TABLET BY MOUTH EVERY DAY  90 tablet  3  . Famotidine ([employer]S ACID REDUCER PO) Take 1 tablet by mouth daily.      . ferrous  sulfate 325 (65 FE) MG EC tablet Take 325 mg by mouth daily with breakfast.        . Glucosamine-Chondroitin (COSAMIN DS PO) Take 1 tablet by mouth at bedtime.        Marland Kitchen glucose blood (FREESTYLE LITE) test strip 1 each by Other route daily as needed for other. Use as instructed  100 each  12  . HYDROcodone-acetaminophen (NORCO) 7.5-325 MG per tablet Take 1 tablet by mouth every 6 (six) hours as needed. For pain       . ibuprofen (ADVIL,MOTRIN) 200 MG tablet Take 200 mg by mouth every 6 (six) hours as needed.      . Lancets (FREESTYLE) lancets 1 each by Other route daily as needed for other. Use as instructed  100 each  12  . metFORMIN (GLUCOPHAGE-XR) 500 MG 24 hr tablet TAKE 2 TABLETS BY MOUTH AT BEDTIME  180 tablet  3  . mometasone-formoterol (DULERA) 100-5 MCG/ACT AERO 2 puffs each am  1 Inhaler  6  . Multiple Vitamins-Minerals (CENTRUM ULTRA WOMENS) TABS Take 1 tablet by mouth daily.      . nebivolol (BYSTOLIC) 10 MG tablet TAKE 1 TABLET (10 MG TOTAL) BY MOUTH DAILY.  90 tablet  3  . traZODone (DESYREL) 100 MG tablet TAKE 1 TABLET AT BEDTIME  90 tablet  2   No current facility-administered medications on file prior to visit.    There were no vitals taken for this visit.   1. Risk factors, based on past  M,S,F history patient has known coronary artery disease. Cardiovascular risk factors include hypertension diabetes and dyslipidemia-   2.  Physical activities:Remains quite active does have some pulmonary disease which has been stable. She is to do with the maintenance bronchodilators   3.  Depression/mood:Prior history depression which has been stable. Remains on Celexa with benefit   4.  Hearing: no significant deficits  5.  ADL's:Independent in all aspects of daily living   6.  Fall risk:Low   7.  Home safety:No problems identified   8.  Height weight, and visual acuity;Height and weight stable no change in visual acuity. He is scheduled for right cataract extraction surgery soon  is status post prior left had her extraction surgery   9.  Counseling:Exercise modest weight loss encouraged   10. Lab orders based on risk factors:Hemoglobin A1c will be reviewed   11. Referral :Follow  cardiology and pulmonary medicine   12. Care plan:Followup colonoscopy 5 years   13. Cognitive assessment: Alert and oriented with normal affect. No cognitive dysfunction  Preventive Screening-Counseling & Management  Alcohol-Tobacco     Smoking Status: quit  Current Problems (verified):  1)  Depression  (ICD-311) 2)  Phlebitis&thrombophleb Sup Veins Upper Extrem  (ICD-451.82) 3)  Tobacco Use, Quit  (ICD-V15.82) 4)  Cellulitis, Hand, Left  (ICD-682.4) 5)  Uri  (ICD-465.9) 6)  Cardiomyopathy, Secondary  (ICD-425.9) 7)  Back Pain, Lumbar  (ICD-724.2) 8)  Rectal Bleeding  (ICD-569.3) 9)  Hiatal Hernia  (ICD-553.3) 10)  Diverticulosis of Colon  (ICD-562.10) 11)  Arteriovenous Malformation  (ICD-747.60) 12)  Chest Pain  (ICD-786.50) 13)  Diverticulitis, Acute  (ICD-562.11) 14)  G I Bleed  (ICD-578.9) 15)  Nephrolithiasis, Hx of  (ICD-V13.01) 16)  Anemia Nec  (ICD-285.8) 17)  Osteoarthritis  (ICD-715.90) 18)  Hyperlipidemia  (ICD-272.4) 19)  Allergic Rhinitis, Seasonal  (ICD-477.0) 20)  Degenerative Joint Disease, Cervical Spine  (ICD-721.90) 21)  Myocardial Infarction, Hx of  (ICD-412) 22)  Hypertension  (ICD-401.9) 23)  Gerd  (ICD-530.81) 24)  Diabetes Mellitus, Type II  (ICD-250.00)   Allergies (verified):  1)  ! Sulfa 2)  ! Penicillin G Potassium (Penicillin G Potassium) 3)  ! Streptomycin Sulfate (Streptomycin Sulfate) 4)  ! Buprenex (Buprenorphine Hcl) 5)  ! * Ivp Dye  Past History:  Past Medical History: Reviewed history from 10/31/2009 and no changes required. coronary artery disease status post non-ST segment elevation MI August 2007 (TakTsbubo CM?) HIATAL HERNIA (ICD-553.3) DIVERTICULOSIS OF COLON (ICD-562.10) ARTERIOVENOUS MALFORMATION  (ICD-747.60) CHEST PAIN (ICD-786.50) DIVERTICULITIS, ACUTE (ICD-562.11) G I BLEED (ICD-578.9) NEPHROLITHIASIS, HX OF (ICD-V13.01) ANEMIA NEC (ICD-285.8) OSTEOARTHRITIS (ICD-715.90) HYPERLIPIDEMIA (ICD-272.4) 1. Nonobstructive coronary artery disease. 2. 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 3. Ejection fraction 55% by recent echo. 4. Diabetes. 5. Hypertension for hyperlipidemia. 6. IVP DYE allergy. ALLERGIC RHINITIS, SEASONAL (ICD-477.0) DEGENERATIVE JOINT DISEASE, CERVICAL SPINE (ICD-721.90) MYOCARDIAL INFARCTION, HX OF (ICD-412) HYPERTENSION (ICD-401.9) GERD (ICD-530.81) DIABETES MELLITUS, TYPE II (ICD-250.00) Depression  Past Surgical History: Cholecystectomy 89 Hysterectomy 1961,94 kidney stone extraction x 2 status post left knee arthroscopic surgery heart catheterization, 1998, and 2007 lithotripsy January 2009 cervical diskectomy 00,04 appendectomy   colonoscopy August 2008  Family History: Reviewed history from 09/20/2008 and no changes required. Father died at 108 of an MI  mother at 90 of an MI and had congestive heart failure one brother died at 41 of an MI two sisters, one status post CABG  Family History of Diabetes: Sister, Father Family History of Breast Cancer: Sister, 2 aunts  Social History: Reviewed history from 09/20/2008 and no changes required. Regular exercise-yes Patient is a former smoker.  widowed 06-2010       Review of Systems  Constitutional: Positive for fatigue. Negative for fever, appetite change and unexpected weight change.  HENT: Positive for congestion. Negative for dental problem, ear pain, hearing loss, mouth sores, nosebleeds, sinus pressure, sore throat, tinnitus, trouble swallowing and voice change.   Eyes: Negative for photophobia, pain, redness and visual disturbance.  Respiratory: Positive for cough and wheezing. Negative for chest  tightness and shortness of breath.   Cardiovascular: Negative for chest pain, palpitations and leg swelling.  Gastrointestinal: Negative for nausea, vomiting, abdominal pain, diarrhea, constipation, blood in stool, abdominal distention and rectal pain.  Genitourinary: Negative for dysuria, urgency, frequency, hematuria, flank pain, vaginal bleeding, vaginal discharge, difficulty urinating,  genital sores, vaginal pain, menstrual problem and pelvic pain.  Musculoskeletal: Negative for arthralgias, back pain and neck stiffness.       Right hip and right knee pain  Skin: Negative for rash.  Neurological: Negative for dizziness, syncope, speech difficulty, weakness, light-headedness, numbness and headaches.  Hematological: Negative for adenopathy. Does not bruise/bleed easily.  Psychiatric/Behavioral: Negative for suicidal ideas, behavioral problems, self-injury, dysphoric mood and agitation. The patient is not nervous/anxious.        Objective:   Physical Exam  Constitutional: She is oriented to person, place, and time. She appears well-developed and well-nourished.  HENT:  Head: Normocephalic and atraumatic.  Right Ear: External ear normal.  Left Ear: External ear normal.  Mouth/Throat: Oropharynx is clear and moist.  Eyes: Conjunctivae and EOM are normal.  Neck: Normal range of motion. Neck supple. No JVD present. No thyromegaly present.  Left supraclavicular bruit  Cardiovascular: Normal rate, regular rhythm, normal heart sounds and intact distal pulses.   No murmur heard. Left posterior tibial pulse full. Other pedal  pulses not easily palpable  Pulmonary/Chest: Effort normal. She has wheezes. She has no rales.  Abdominal: Soft. Bowel sounds are normal. She exhibits no distension and no mass. There is no tenderness. There is no rebound and no guarding.  Genitourinary: Vagina normal.  Musculoskeletal: Normal range of motion. She exhibits no edema and no tenderness.  Neurological: She is  alert and oriented to person, place, and time. She has normal reflexes. No cranial nerve deficit. She exhibits normal muscle tone. Coordination normal.  Skin: Skin is warm and dry. No rash noted.  Psychiatric: She has a normal mood and affect. Her behavior is normal.          Assessment & Plan:  Preventive health examination Diabetes. We'll check a hemoglobin A1c Hypertension stable Dyslipidemia. Continue atorvastatin COPD. We'll continue a maintenance medication. The patient was asked to take albuterol every 6 hours as needed for wheezing and to add an expectorant OAB-  evaluated by urology and doing quite well ontoviaz   Recheck 3 months

## 2013-08-10 ENCOUNTER — Telehealth: Payer: Self-pay | Admitting: Internal Medicine

## 2013-08-10 NOTE — Telephone Encounter (Signed)
Spoke to pt told her to finish the Benicar first and then start the Lisinopril/HCTZ do not take both. Pt verbalized understanding.

## 2013-08-10 NOTE — Telephone Encounter (Signed)
Pt has a question bout the new med lisinopril-hydrochlorothiazide (PRINZIDE,ZESTORETIC) 20-25 MG md gave her yesterday.  Pt states she is not sure if this is to replace benicar . Is she to take both? md even gave her samples of benicar, so she does not know what to take?

## 2013-08-25 ENCOUNTER — Encounter: Payer: Self-pay | Admitting: Cardiology

## 2013-08-25 ENCOUNTER — Ambulatory Visit: Payer: Medicare Other | Admitting: Cardiology

## 2013-08-25 ENCOUNTER — Ambulatory Visit (INDEPENDENT_AMBULATORY_CARE_PROVIDER_SITE_OTHER): Payer: Medicare Other | Admitting: Cardiology

## 2013-08-25 VITALS — BP 124/62 | HR 65 | Ht 63.5 in | Wt 192.0 lb

## 2013-08-25 DIAGNOSIS — I10 Essential (primary) hypertension: Secondary | ICD-10-CM

## 2013-08-25 DIAGNOSIS — I6529 Occlusion and stenosis of unspecified carotid artery: Secondary | ICD-10-CM

## 2013-08-25 DIAGNOSIS — E785 Hyperlipidemia, unspecified: Secondary | ICD-10-CM

## 2013-08-25 DIAGNOSIS — I5181 Takotsubo syndrome: Secondary | ICD-10-CM

## 2013-08-25 NOTE — Progress Notes (Signed)
Patient ID: Leslie [name], female   DOB: [birth year], 79 y.o.   MRN: [id number] PCP: Dr. Amador Cunas  79 yo with diabetes, HTN, and hyperlipidemia as well as history of presumed Takotsubo cardiomyopathy presents for cardiology followup.  Last echo showed EF back to 50-55% in 2/12.    Dobutamine myoview was done in 10/12, showing EF 51% and no evidence for ischemia/infarction.  She is seeing pulmonary for asthma/COPD and has only rare wheezing now.  Very mild dyspnea after walking about 100 yards.  No chest pain.  No orthopnea/PND.  She has knee and hip pain chronically.   ECG: NSR, iRBBB, 1/2 mm anterolateral ST depression (nonspecific) similar to prior.    Labs (10/11): K 4.7, creatinine 0.8, LDL 86, HDL 49, TSH normal  Labs (6/21): K 4.4, creatinine 0.9, BNP 82 Labs (12/12): K 4.3, creatinine 1.2, TSH normal, LDL 67, HDL 54 Labs (11/14): K 5.1, creatinine 1.1, TSH normal, LDL 67, HDL 53  Allergies:  1) ! Sulfa  2) ! Penicillin G Potassium (Penicillin G Potassium)  3) ! Streptomycin Sulfate (Streptomycin Sulfate)  4) ! Buprenex (Buprenorphine Hcl)  5) ! * Ivp Dye   Past Medical History:  1. Takotsubo cardiomyopathy: Sister died in 2023-04-23 and patient developed CP with ECG changes and evidence for NSTEMI. LHC showed mild nonobstructive CAD with mildly decreased LV systolic function. Echo in 2/11 showed EF 40-45% with diffuse hypokinesis. Echo (2/12) showed EF 50-55% with no significant valvular abnormalities and normal RV.  2. LHC April 23, 2023): Luminal irregularities. Dobutamine myoview (10/12): EF 51%, no ischemia or infarction.  3. HIATAL HERNIA (ICD-553.3) and GERD  4. DIVERTICULOSIS OF COLON (ICD-562.10): History of GI bleeding.  5. ARTERIOVENOUS MALFORMATION (ICD-747.60)  6. NEPHROLITHIASIS, HX OF (ICD-V13.01)  7. ANEMIA NEC (ICD-285.8)  8. OSTEOARTHRITIS (ICD-715.90)  9. Hyperlipidemia  10. Diabetes.  11. HTN  12. ALLERGIC RHINITIS, SEASONAL (ICD-477.0)  13. DEGENERATIVE JOINT DISEASE,  CERVICAL SPINE (ICD-721.90)  14. Depression  15. Carotid stenosis: Carotid dopplers (10/12) with 40-59% RICA.  Carotid dopplers (10/13) with only mild disease bilaterally.  16. Asthma 17. 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 albuterol (PROVENTIL HFA;VENTOLIN HFA) 108 (90 BASE) MCG/ACT inhaler Inhale 2 puffs into the lungs every 6 (six) hours as needed for wheezing.  1 Inhaler  0  . amLODipine (NORVASC) 5 MG tablet TAKE 1 TABLET (5 MG TOTAL) BY MOUTH DAILY.  90 tablet  3  . aspirin 81 MG tablet Take 81 mg by mouth daily.        Marland Kitchen atorvastatin (LIPITOR) 20 MG tablet TAKE 1 TABLET (20 MG TOTAL) BY MOUTH DAILY.  90 tablet  2  . calcium gluconate 500 MG tablet Take 500 mg by mouth 2 (two) times daily.       . Chlorpheniramine Maleate ([employer]S ALLERGY RELIEF PO) Take 1 tablet by mouth daily.      . Cholecalciferol (VITAMIN D) 400 UNITS capsule Take 400 Units by mouth daily.        Marland Kitchen  citalopram (CELEXA) 40 MG tablet TAKE 1 TABLET BY MOUTH EVERY DAY  90 tablet  3  . Famotidine (CVS ACID REDUCER PO) Take 1 tablet by mouth daily.      . ferrous sulfate 325 (65 FE) MG EC tablet Take 325 mg by mouth daily with breakfast.        . Glucosamine-Chondroitin (COSAMIN DS PO) Take 1 tablet by mouth at bedtime.        Marland Kitchen glucose blood (FREESTYLE LITE) test strip 1 each by Other route daily as needed for other. Use as instructed  100 each  12  . ibuprofen (ADVIL,MOTRIN) 200 MG tablet Take 200 mg by mouth every 6 (six)  hours as needed.      . Lancets (FREESTYLE) lancets 1 each by Other route daily as needed for other. Use as instructed  100 each  12  . metFORMIN (GLUCOPHAGE-XR) 500 MG 24 hr tablet TAKE 2 TABLETS BY MOUTH AT BEDTIME  180 tablet  3  . mometasone-formoterol (DULERA) 100-5 MCG/ACT AERO 2 puffs each am  1 Inhaler  6  . Multiple Vitamins-Minerals (CENTRUM ULTRA WOMENS) TABS Take 1 tablet by mouth daily.      . nebivolol (BYSTOLIC) 10 MG tablet TAKE 1 TABLET (10 MG TOTAL) BY MOUTH DAILY.  90 tablet  3  . olmesartan-hydrochlorothiazide (BENICAR HCT) 40-12.5 MG per tablet Take 1 tablet by mouth daily.      . traZODone (DESYREL) 100 MG tablet TAKE 1 TABLET AT BEDTIME  90 tablet  2  . fesoterodine (TOVIAZ) 4 MG TB24 tablet Take 4 mg by mouth daily.       No current facility-administered medications for this visit.    BP 124/62  Pulse 65  Ht 5' 3.5" (1.613 m)  Wt 192 lb (87.091 kg)  BMI 33.47 kg/m2 General: NAD Neck: No JVD, no thyromegaly or thyroid nodule.  Lungs: Some wheezing, seems to be upper airways in origin CV: Nondisplaced PMI.  Heart regular S1/S2, no S3/S4, no murmur.  No peripheral edema.  No carotid bruit.  Normal pedal pulses.  Abdomen: Soft, nontender, no hepatosplenomegaly, no distention.  Neurologic: Alert and oriented x 3.  Psych: Normal affect. Extremities: No clubbing or cyanosis.   Assessment/Plan: 1. History of Takotsubo cardiomyopathy: EF recovered on last echo. She is doing well in general.  Continue beta blocker and ARB.  2. Carotid stenosis: Only mild carotid disease on 10/13 dopplers.  3. Hyperlipidemia: Excellent lipids recently.  4. HTN: BP well-controlled.   Marca Ancona 08/25/2013

## 2013-09-13 ENCOUNTER — Other Ambulatory Visit: Payer: Self-pay

## 2013-09-13 DIAGNOSIS — Z1231 Encounter for screening mammogram for malignant neoplasm of breast: Secondary | ICD-10-CM

## 2013-10-15 ENCOUNTER — Ambulatory Visit: Payer: Medicare Other

## 2013-10-20 ENCOUNTER — Ambulatory Visit
Admission: RE | Admit: 2013-10-20 | Discharge: 2013-10-20 | Disposition: A | Discharge status: Home / Self Care | Payer: Medicare Other | Source: Ambulatory Visit

## 2013-10-20 DIAGNOSIS — Z1231 Encounter for screening mammogram for malignant neoplasm of breast: Secondary | ICD-10-CM

## 2013-11-09 ENCOUNTER — Other Ambulatory Visit (INDEPENDENT_AMBULATORY_CARE_PROVIDER_SITE_OTHER): Payer: Medicare Other

## 2013-11-09 DIAGNOSIS — E119 Type 2 diabetes mellitus without complications: Secondary | ICD-10-CM

## 2013-11-09 LAB — HEMOGLOBIN A1C: Hgb A1c MFr Bld: 6.9 % — ABNORMAL HIGH (ref 4.6–6.5)

## 2013-11-14 ENCOUNTER — Other Ambulatory Visit: Payer: Self-pay | Admitting: Internal Medicine

## 2013-12-05 ENCOUNTER — Other Ambulatory Visit: Payer: Self-pay | Admitting: Internal Medicine

## 2014-01-03 ENCOUNTER — Telehealth: Payer: Self-pay | Admitting: Internal Medicine

## 2014-01-03 NOTE — Telephone Encounter (Signed)
Patient Information:  Caller Name: Gretchen Portelamogene  Phone: [phone]  Patient: Leslie [name], Leslie [name]  Gender: Female  DOB: 06/09/1933  Age: 80 Years  PCP: Eleonore ChiquitoKwiatkowski, [facility](Family Practice)  Office Follow Up:  Does the office need to follow up with this patient?: Yes  Instructions For The Office: Referred patient to ED [facility]([place]) for pain 8/10 (moaning and discomfort during triage) Hx of kidney stones in the past.  RN Note:  Referred patient to ED [facility]([place]) for pain 8/10 (moaning and discomfort during triage) Hx of kidney stones in the past.  Symptoms  Reason For Call & Symptoms: (1) Patient states that she ate spicy food on Friday 12/31/13 and that " aggravated her hiatel hernia" pain in upper rib cage,  intermittent and belching . +nausea . She describes intermittent chills but  Afebrile.   (2)  She is having Bilateral flank pain  L > R. non radiating onset friday. intermittent.   no urinary pain, no blood in urine but does have a urinary odor. History of kidney stones.   Rates pain 8/10 and occasonal moaning.  Reviewed Health History In EMR: Yes  Reviewed Medications In EMR: Yes  Reviewed Allergies In EMR: Yes  Reviewed Surgeries / Procedures: Yes  Date of Onset of Symptoms: 12/31/2013  Guideline(s) Used:  Flank Pain  Disposition Per Guideline:   Go to ED Now  Reason For Disposition Reached:   Abdominal pain and age > 4960  Advice Given:  Call Back If:  Fever over 100.5 F (38.1 C)  Burning with urination or blood in urine  Pain lasts over 3 days  You become worse.  RN Overrode Recommendation:  Go To ED  Referred patient to ED [facility]([place]) for pain 8/10 (moaning and discomfort during triage) Hx of kidney stones in the past.

## 2014-01-03 NOTE — Telephone Encounter (Signed)
FYI

## 2014-02-01 ENCOUNTER — Other Ambulatory Visit: Payer: Medicare Other

## 2014-02-02 ENCOUNTER — Other Ambulatory Visit (INDEPENDENT_AMBULATORY_CARE_PROVIDER_SITE_OTHER): Payer: Medicare Other

## 2014-02-02 DIAGNOSIS — E1165 Type 2 diabetes mellitus with hyperglycemia: Principal | ICD-10-CM

## 2014-02-02 DIAGNOSIS — IMO0001 Reserved for inherently not codable concepts without codable children: Secondary | ICD-10-CM

## 2014-02-02 LAB — HEMOGLOBIN A1C: HEMOGLOBIN A1C: 7 % — AB (ref 4.6–6.5)

## 2014-02-08 ENCOUNTER — Encounter: Payer: Self-pay | Admitting: Internal Medicine

## 2014-02-08 ENCOUNTER — Ambulatory Visit (INDEPENDENT_AMBULATORY_CARE_PROVIDER_SITE_OTHER): Payer: Medicare Other | Admitting: Internal Medicine

## 2014-02-08 VITALS — BP 120/70 | HR 64 | Temp 98.1°F | Resp 20 | Ht 63.5 in | Wt 197.0 lb

## 2014-02-08 DIAGNOSIS — M1990 Unspecified osteoarthritis, unspecified site: Secondary | ICD-10-CM

## 2014-02-08 DIAGNOSIS — E785 Hyperlipidemia, unspecified: Secondary | ICD-10-CM

## 2014-02-08 DIAGNOSIS — E119 Type 2 diabetes mellitus without complications: Secondary | ICD-10-CM

## 2014-02-08 DIAGNOSIS — I10 Essential (primary) hypertension: Secondary | ICD-10-CM

## 2014-02-08 NOTE — Progress Notes (Signed)
Subjective:    Patient ID: Leslie [name], female    DOB: 03/30/1933, 80 y.o.   MRN: [id number]  HPI  80 year old patient who is seen today for her biannual followup.  She has type 2 diabetes and recent hemoglobin A1c has increased slightly to 7 point 0.  There's been some weight gain.  She has hypertension, dyslipidemia, and remains on statin therapy.  Doing reasonably well.  Hip arthritis interferes with the exercise program somewhat.  She has a history of asthma, which has been stable.  She is followed by cardiology. Her cardiopulmonary status appears stable. She is followed by ophthalmology twice annually  Past Medical History  Diagnosis Date  . ALLERGIC RHINITIS, SEASONAL 05/06/2007  . ANEMIA NEC 05/20/2007  . ARTERIOVENOUS MALFORMATION 09/20/2008  . BACK PAIN, LUMBAR 09/21/2008  . CARDIOMYOPATHY, SECONDARY 11/21/2008  . DEGENERATIVE JOINT DISEASE, CERVICAL SPINE 05/06/2007  . DEPRESSION 10/31/2009  . DIABETES MELLITUS, TYPE II 05/06/2007  . DIVERTICULOSIS OF COLON 09/20/2008  . G I BLEED 10/14/2007  . GERD 05/06/2007  . HIATAL HERNIA 09/20/2008  . HYPERLIPIDEMIA 05/06/2007  . HYPERTENSION 05/06/2007  . MYOCARDIAL INFARCTION, HX OF 05/06/2007  . NEPHROLITHIASIS, HX OF 07/14/2007  . OSTEOARTHRITIS 05/06/2007  . PHLEBITIS&THROMBOPHLEB SUP VEINS UPPER EXTREM 07/25/2009  . TOBACCO USE, QUIT 07/20/2009  . Blood type O+     History   Social History  . Marital Status: Widowed    Spouse Name: N/A    Number of Children: 0  . Years of Education: N/A   Occupational History  . Not on file.   Social History Main Topics  . Smoking status: Former Smoker -- 1.00 packs/day for 30 years    Types: Cigarettes    Quit date: 07/15/1984  . Smokeless tobacco: Never Used  . Alcohol Use: Yes     Comment: 2-3 glasses of wine  . Drug Use: No  . Sexual Activity: Not on file   Other Topics Concern  . Not on file   Social History Narrative  . No narrative on file    Past Surgical History  Procedure  Laterality Date  . Abdominal hysterectomy    . Appendectomy    . Cholecystectomy    . Kidney stone surgery      x 2  . Knee arthroscopy      left  . Cardiac catheterization    . Cervical disc surgery      Family History  Problem Relation Age of Onset  . Heart disease Mother   . Heart failure Mother   . Heart attack Father   . Heart attack Brother   . Heart disease Sister     x 2  . Diabetes Sister   . Diabetes      aunts x 2  . Breast cancer Sister   . Breast cancer      Allergies  Allergen Reactions  . Ivp Dye [Iodinated Diagnostic Agents] Anaphylaxis  . Buprenorphine Hcl   . Macrolides And Ketolides   . Penicillins Hives  . Streptomycin   . Sulfonamide Derivatives Hives    Current Outpatient Prescriptions on File Prior to Visit  Medication Sig Dispense Refill  . albuterol (PROVENTIL HFA;VENTOLIN HFA) 108 (90 BASE) MCG/ACT inhaler Inhale 2 puffs into the lungs every 6 (six) hours as needed for wheezing.  1 Inhaler  0  . amLODipine (NORVASC) 5 MG tablet TAKE 1 TABLET (5 MG TOTAL) BY MOUTH DAILY.  90 tablet  1  . aspirin 81 MG tablet  Take 81 mg by mouth daily.        Marland Kitchen. atorvastatin (LIPITOR) 20 MG tablet TAKE 1 TABLET (20 MG TOTAL) BY MOUTH DAILY.  90 tablet  1  . calcium gluconate 500 MG tablet Take 500 mg by mouth 2 (two) times daily.       . Chlorpheniramine Maleate ([employer] ALLERGY RELIEF PO) Take 1 tablet by mouth daily.      . Cholecalciferol (VITAMIN D) 400 UNITS capsule Take 400 Units by mouth daily.        . citalopram (CELEXA) 40 MG tablet TAKE 1 TABLET BY MOUTH EVERY DAY  90 tablet  1  . Famotidine ([facility]S ACID REDUCER PO) Take 1 tablet by mouth daily.      . ferrous sulfate 325 (65 FE) MG EC tablet Take 325 mg by mouth daily with breakfast.        . fesoterodine (TOVIAZ) 4 MG TB24 tablet Take 4 mg by mouth daily.      . Glucosamine-Chondroitin (COSAMIN DS PO) Take 1 tablet by mouth at bedtime.        Marland Kitchen. glucose blood (FREESTYLE LITE) test strip 1 each by Other  route daily as needed for other. Use as instructed  100 each  12  . ibuprofen (ADVIL,MOTRIN) 200 MG tablet Take 200 mg by mouth every 6 (six) hours as needed.      . Lancets (FREESTYLE) lancets 1 each by Other route daily as needed for other. Use as instructed  100 each  12  . metFORMIN (GLUCOPHAGE-XR) 500 MG 24 hr tablet TAKE 2 TABLETS BY MOUTH AT BEDTIME  180 tablet  1  . Multiple Vitamins-Minerals (CENTRUM ULTRA WOMENS) TABS Take 1 tablet by mouth daily.      . nebivolol (BYSTOLIC) 10 MG tablet TAKE 1 TABLET (10 MG TOTAL) BY MOUTH DAILY.  90 tablet  3  . traZODone (DESYREL) 100 MG tablet Take 1 tablet (100 mg total) by mouth at bedtime as needed for sleep.  90 tablet  1   No current facility-administered medications on file prior to visit.    BP 120/70  Pulse 64  Temp(Src) 98.1 F (36.7 C) (Oral)  Resp 20  Ht 5' 3.5" (1.613 m)  Wt 197 lb (89.359 kg)  BMI 34.35 kg/m2  SpO2 97%     Review of Systems  Constitutional: Positive for unexpected weight change.  HENT: Negative for congestion, dental problem, hearing loss, rhinorrhea, sinus pressure, sore throat and tinnitus.   Eyes: Negative for pain, discharge and visual disturbance.  Respiratory: Negative for cough and shortness of breath.   Cardiovascular: Negative for chest pain, palpitations and leg swelling.  Gastrointestinal: Negative for nausea, vomiting, abdominal pain, diarrhea, constipation, blood in stool and abdominal distention.  Genitourinary: Negative for dysuria, urgency, frequency, hematuria, flank pain, vaginal bleeding, vaginal discharge, difficulty urinating, vaginal pain and pelvic pain.  Musculoskeletal: Positive for arthralgias and back pain. Negative for gait problem and joint swelling.  Skin: Negative for rash.  Neurological: Negative for dizziness, syncope, speech difficulty, weakness, numbness and headaches.  Hematological: Negative for adenopathy.  Psychiatric/Behavioral: Negative for behavioral problems,  dysphoric mood and agitation. The patient is not nervous/anxious.        Objective:   Physical Exam  Constitutional: She is oriented to person, place, and time. She appears well-developed and well-nourished.  Weight 197  HENT:  Head: Normocephalic.  Right Ear: External ear normal.  Left Ear: External ear normal.  Mouth/Throat: Oropharynx is clear and moist.  Eyes: Conjunctivae and EOM are normal. Pupils are equal, round, and reactive to light.  Neck: Normal range of motion. Neck supple. No thyromegaly present.  Cardiovascular: Normal rate, regular rhythm, normal heart sounds and intact distal pulses.   Pulmonary/Chest: Effort normal and breath sounds normal.  Abdominal: Soft. Bowel sounds are normal. She exhibits no mass. There is no tenderness.  Musculoskeletal: Normal range of motion.  Lymphadenopathy:    She has no cervical adenopathy.  Neurological: She is alert and oriented to person, place, and time.  Skin: Skin is warm and dry. No rash noted.  Psychiatric: She has a normal mood and affect. Her behavior is normal.          Assessment & Plan:   Diabetes mellitus.  Hemoglobin A1c 7 point 0.  Better exercise regimen.  Modest weight loss encouraged.   Asthma, stable.   Hypertension, well controlled Dyslipidemia, stable.  Continue statin therapy  CPX 6 months

## 2014-02-08 NOTE — Patient Instructions (Signed)
Limit your sodium (Salt) intake    It is important that you exercise regularly, at least 20 minutes 3 to 4 times per week.  If you develop chest pain or shortness of breath seek  medical attention.  You need to lose weight.  Consider a lower calorie diet and regular exercise. 

## 2014-02-08 NOTE — Progress Notes (Signed)
Pre-visit discussion using our clinic review tool. No additional management support is needed unless otherwise documented below in the visit note.  

## 2014-02-09 ENCOUNTER — Telehealth: Payer: Self-pay | Admitting: Internal Medicine

## 2014-02-09 NOTE — Telephone Encounter (Signed)
Relevant patient education mailed to patient.  

## 2014-04-18 ENCOUNTER — Other Ambulatory Visit: Payer: Self-pay | Admitting: Internal Medicine

## 2014-05-12 ENCOUNTER — Other Ambulatory Visit: Payer: Self-pay | Admitting: Internal Medicine

## 2014-05-25 ENCOUNTER — Other Ambulatory Visit: Payer: Self-pay | Admitting: Internal Medicine

## 2014-06-07 ENCOUNTER — Other Ambulatory Visit: Payer: Self-pay | Admitting: Internal Medicine

## 2014-07-13 ENCOUNTER — Other Ambulatory Visit: Payer: Self-pay | Admitting: Internal Medicine

## 2014-08-11 ENCOUNTER — Other Ambulatory Visit (INDEPENDENT_AMBULATORY_CARE_PROVIDER_SITE_OTHER): Payer: Medicare Other

## 2014-08-11 DIAGNOSIS — E119 Type 2 diabetes mellitus without complications: Secondary | ICD-10-CM

## 2014-08-11 DIAGNOSIS — E785 Hyperlipidemia, unspecified: Secondary | ICD-10-CM

## 2014-08-11 DIAGNOSIS — Z0000 Encounter for general adult medical examination without abnormal findings: Secondary | ICD-10-CM

## 2014-08-11 DIAGNOSIS — I10 Essential (primary) hypertension: Secondary | ICD-10-CM

## 2014-08-11 LAB — POCT URINALYSIS DIPSTICK
Bilirubin, UA: NEGATIVE
Blood, UA: NEGATIVE
GLUCOSE UA: NEGATIVE
KETONES UA: NEGATIVE
Nitrite, UA: NEGATIVE
PROTEIN UA: NEGATIVE
UROBILINOGEN UA: 0.2
pH, UA: 6

## 2014-08-11 LAB — CBC WITH DIFFERENTIAL/PLATELET
Basophils Absolute: 0 10*3/uL (ref 0.0–0.1)
Basophils Relative: 0.2 % (ref 0.0–3.0)
EOS ABS: 0.3 10*3/uL (ref 0.0–0.7)
EOS PCT: 5 % (ref 0.0–5.0)
HEMATOCRIT: 37.2 % (ref 36.0–46.0)
Hemoglobin: 12.3 g/dL (ref 12.0–15.0)
LYMPHS ABS: 1.6 10*3/uL (ref 0.7–4.0)
Lymphocytes Relative: 25.6 % (ref 12.0–46.0)
MCHC: 32.9 g/dL (ref 30.0–36.0)
MCV: 95.2 fl (ref 78.0–100.0)
Monocytes Absolute: 0.6 10*3/uL (ref 0.1–1.0)
Monocytes Relative: 8.8 % (ref 3.0–12.0)
NEUTROS PCT: 60.4 % (ref 43.0–77.0)
Neutro Abs: 3.8 10*3/uL (ref 1.4–7.7)
PLATELETS: 237 10*3/uL (ref 150.0–400.0)
RBC: 3.91 Mil/uL (ref 3.87–5.11)
RDW: 12.8 % (ref 11.5–15.5)
WBC: 6.3 10*3/uL (ref 4.0–10.5)

## 2014-08-11 LAB — LIPID PANEL
CHOL/HDL RATIO: 4
CHOLESTEROL: 129 mg/dL (ref 0–200)
HDL: 34.1 mg/dL — ABNORMAL LOW (ref >39.00)
LDL CALC: 59 mg/dL (ref 0–99)
NonHDL: 94.9
TRIGLYCERIDES: 178 mg/dL — AB (ref 0.0–149.0)
VLDL: 35.6 mg/dL (ref 0.0–40.0)

## 2014-08-11 LAB — BASIC METABOLIC PANEL
BUN: 18 mg/dL (ref 6–23)
CO2: 27 meq/L (ref 19–32)
CREATININE: 1.2 mg/dL (ref 0.4–1.2)
Calcium: 9.6 mg/dL (ref 8.4–10.5)
Chloride: 106 mEq/L (ref 96–112)
GFR: 44.29 mL/min — ABNORMAL LOW (ref >60.00)
Glucose, Bld: 143 mg/dL — ABNORMAL HIGH (ref 70–99)
Potassium: 4.4 mEq/L (ref 3.5–5.1)
Sodium: 142 mEq/L (ref 135–145)

## 2014-08-11 LAB — HEPATIC FUNCTION PANEL
ALBUMIN: 3.2 g/dL — AB (ref 3.5–5.2)
ALT: 14 U/L (ref 0–35)
AST: 17 U/L (ref 0–37)
Alkaline Phosphatase: 69 U/L (ref 39–117)
BILIRUBIN TOTAL: 0.5 mg/dL (ref 0.2–1.2)
Bilirubin, Direct: 0 mg/dL (ref 0.0–0.3)
Total Protein: 6.7 g/dL (ref 6.0–8.3)

## 2014-08-11 LAB — HEMOGLOBIN A1C: HEMOGLOBIN A1C: 6.8 % — AB (ref 4.6–6.5)

## 2014-08-11 LAB — TSH: TSH: 3.9 u[IU]/mL (ref 0.35–4.50)

## 2014-08-15 ENCOUNTER — Encounter: Payer: Self-pay | Admitting: Cardiology

## 2014-08-16 ENCOUNTER — Encounter: Payer: Medicare Other | Admitting: Internal Medicine

## 2014-08-17 ENCOUNTER — Ambulatory Visit (INDEPENDENT_AMBULATORY_CARE_PROVIDER_SITE_OTHER): Payer: Medicare Other | Admitting: Internal Medicine

## 2014-08-17 ENCOUNTER — Encounter: Payer: Self-pay | Admitting: Internal Medicine

## 2014-08-17 DIAGNOSIS — I10 Essential (primary) hypertension: Secondary | ICD-10-CM

## 2014-08-17 DIAGNOSIS — I252 Old myocardial infarction: Secondary | ICD-10-CM

## 2014-08-17 DIAGNOSIS — Z0000 Encounter for general adult medical examination without abnormal findings: Secondary | ICD-10-CM

## 2014-08-17 DIAGNOSIS — M159 Polyosteoarthritis, unspecified: Secondary | ICD-10-CM

## 2014-08-17 DIAGNOSIS — J4489 Other specified chronic obstructive pulmonary disease: Secondary | ICD-10-CM

## 2014-08-17 DIAGNOSIS — Z23 Encounter for immunization: Secondary | ICD-10-CM

## 2014-08-17 DIAGNOSIS — R0989 Other specified symptoms and signs involving the circulatory and respiratory systems: Secondary | ICD-10-CM

## 2014-08-17 DIAGNOSIS — E785 Hyperlipidemia, unspecified: Secondary | ICD-10-CM

## 2014-08-17 DIAGNOSIS — I808 Phlebitis and thrombophlebitis of other sites: Secondary | ICD-10-CM

## 2014-08-17 DIAGNOSIS — J449 Chronic obstructive pulmonary disease, unspecified: Secondary | ICD-10-CM

## 2014-08-17 DIAGNOSIS — I429 Cardiomyopathy, unspecified: Secondary | ICD-10-CM

## 2014-08-17 DIAGNOSIS — M150 Primary generalized (osteo)arthritis: Secondary | ICD-10-CM

## 2014-08-17 NOTE — Progress Notes (Signed)
Subjective:    Patient ID: Leslie [name], female    DOB: October 04, 1932, 80 y.o.   MRN: [id number]  HPI  Pre-visit discussion using our clinic review tool. No additional management support is needed unless otherwise documented below in the visit note. Wt Readings from Last 3 Encounters:  08/17/14 190 lb (86.183 kg)  02/08/14 197 lb (89.359 kg)  08/25/13 192 lb (87.32 kg)    80 -year-old patient who is seen today for a preventive health examination.  She is followed by cardiology and pulmonary. Her cardiopulmonary status has been stable. She's had a  carotid duplex study (2013 mild disease). She has type 2 diabetes which has been stable. She receives annual gynecologic evaluations. She has treated hypertension type 2 diabetes as well as dyslipidemia  Past Medical History  Diagnosis Date  . ALLERGIC RHINITIS, SEASONAL 05/06/2007  . ANEMIA NEC 05/20/2007  . ARTERIOVENOUS MALFORMATION 09/20/2008  . BACK PAIN, LUMBAR 09/21/2008  . CARDIOMYOPATHY, SECONDARY 11/21/2008  . DEGENERATIVE JOINT DISEASE, CERVICAL SPINE 05/06/2007  . DEPRESSION 10/31/2009  . DIABETES MELLITUS, TYPE II 05/06/2007  . DIVERTICULOSIS OF COLON 09/20/2008  . G I BLEED 10/14/2007  . GERD 05/06/2007  . HIATAL HERNIA 09/20/2008  . HYPERLIPIDEMIA 05/06/2007  . HYPERTENSION 05/06/2007  . MYOCARDIAL INFARCTION, HX OF 05/06/2007  . NEPHROLITHIASIS, HX OF 07/14/2007  . OSTEOARTHRITIS 05/06/2007  . PHLEBITIS&THROMBOPHLEB SUP VEINS UPPER EXTREM 07/25/2009  . TOBACCO USE, QUIT 07/20/2009  . Blood type O+     History   Social History  . Marital Status: Widowed    Spouse Name: N/A    Number of Children: 0  . Years of Education: N/A   Occupational History  . Not on file.   Social History Main Topics  . Smoking status: Former Smoker -- 1.00 packs/day for 30 years    Types: Cigarettes    Quit date: 07/15/1984  . Smokeless tobacco: Never Used  . Alcohol Use: Yes     Comment: 2-3 glasses of wine  . Drug Use: No  . Sexual Activity:  Not on file   Other Topics Concern  . Not on file   Social History Narrative    Past Surgical History  Procedure Laterality Date  . Abdominal hysterectomy    . Appendectomy    . Cholecystectomy    . Kidney stone surgery      x 2  . Knee arthroscopy      left  . Cardiac catheterization    . Cervical disc surgery      Family History  Problem Relation Age of Onset  . Heart disease Mother   . Heart failure Mother   . Heart attack Father   . Heart attack Brother   . Heart disease Sister     x 2  . Diabetes Sister   . Diabetes      aunts x 2  . Breast cancer Sister   . Breast cancer      Allergies  Allergen Reactions  . Ivp Dye [Iodinated Diagnostic Agents] Anaphylaxis  . Buprenorphine Hcl   . Macrolides And Ketolides   . Penicillins Hives  . Streptomycin   . Sulfonamide Derivatives Hives    Current Outpatient Prescriptions on File Prior to Visit  Medication Sig Dispense Refill  . albuterol (PROVENTIL HFA;VENTOLIN HFA) 108 (90 BASE) MCG/ACT inhaler Inhale 2 puffs into the lungs every 6 (six) hours as needed for wheezing. 1 Inhaler 0  . amLODipine (NORVASC) 5 MG tablet TAKE 1 TABLET (5 MG  TOTAL) BY MOUTH DAILY. 90 tablet 1  . aspirin 81 MG tablet Take 81 mg by mouth daily.      Marland Kitchen. atorvastatin (LIPITOR) 20 MG tablet TAKE 1 TABLET (20 MG TOTAL) BY MOUTH DAILY. 90 tablet 1  . BYSTOLIC 10 MG tablet TAKE 1 TABLET (10 MG TOTAL) BY MOUTH DAILY. 90 tablet 1  . calcium gluconate 500 MG tablet Take 500 mg by mouth 2 (two) times daily.     . Chlorpheniramine Maleate ([employer]S ALLERGY RELIEF PO) Take 1 tablet by mouth daily.    . Cholecalciferol (VITAMIN D) 400 UNITS capsule Take 400 Units by mouth daily.      . citalopram (CELEXA) 40 MG tablet TAKE 1 TABLET BY MOUTH EVERY DAY 90 tablet 1  . Famotidine ([facility]S ACID REDUCER PO) Take 1 tablet by mouth daily.    . ferrous sulfate 325 (65 FE) MG EC tablet Take 325 mg by mouth daily with breakfast.      . fesoterodine (TOVIAZ) 4 MG TB24  tablet Take 4 mg by mouth daily.    . Glucosamine-Chondroitin (COSAMIN DS PO) Take 1 tablet by mouth at bedtime.      Marland Kitchen. glucose blood (FREESTYLE LITE) test strip 1 each by Other route daily as needed for other. Use as instructed 100 each 12  . ibuprofen (ADVIL,MOTRIN) 200 MG tablet Take 200 mg by mouth every 6 (six) hours as needed.    . Lancets (FREESTYLE) lancets 1 each by Other route daily as needed for other. Use as instructed 100 each 12  . lisinopril-hydrochlorothiazide (PRINZIDE,ZESTORETIC) 20-25 MG per tablet Take 1 tablet by mouth daily.    . metFORMIN (GLUCOPHAGE-XR) 500 MG 24 hr tablet TAKE 2 TABLETS BY MOUTH AT BEDTIME 180 tablet 1  . Multiple Vitamins-Minerals (CENTRUM ULTRA WOMENS) TABS Take 1 tablet by mouth daily.    . traZODone (DESYREL) 100 MG tablet TAKE 1 TABLET BY MOUTH AT BEDTIME AS NEEDED 90 tablet 1   No current facility-administered medications on file prior to visit.    BP 110/70 mmHg  Pulse 58  Temp(Src) 98.3 F (36.8 C) (Oral)  Resp 20  Ht 5' 3.5" (1.613 m)  Wt 190 lb (86.183 kg)  BMI 33.12 kg/m2  SpO2 97%   1. Risk factors, based on past  M,S,F history patient has known coronary artery disease. Cardiovascular risk factors include hypertension diabetes and dyslipidemia-   2.  Physical activities:Remains quite active does have some pulmonary disease which has been stable. She is to do with the maintenance bronchodilators   3.  Depression/mood:Prior history depression which has been stable. Remains on Celexa with benefit   4.  Hearing: no significant deficits  5.  ADL's:Independent in all aspects of daily living   6.  Fall risk:Low   7.  Home safety:No problems identified   8.  Height weight, and visual acuity;Height and weight stable no change in visual acuity. He is scheduled for right cataract extraction surgery soon is status post prior left had her extraction surgery   9.  Counseling:Exercise modest weight loss encouraged   10. Lab orders based  on risk factors:Hemoglobin A1c will be reviewed   11. Referral :Follow cardiology and pulmonary medicine   12. Care plan:Followup colonoscopy 5 years   13. Cognitive assessment: Alert and oriented with normal affect. No cognitive dysfunction  14.  Preventive services will include an annual health assessment with a screening lab.  She has periodic gynecologic evaluations as well as by annual eye examinations.  She is seen by urology occasionally.  previously 15.  Provider list updated.  Includes primary care ophthalmology, cardiology, pulmonary and urology as well as GYN and radiology  Preventive Screening-Counseling & Management  Alcohol-Tobacco     Smoking Status: quit  Current Problems (verified):  1)  Depression  (ICD-311) 2)  Phlebitis&thrombophleb Sup Veins Upper Extrem  (ICD-451.82) 3)  Tobacco Use, Quit  (ICD-V15.82) 4)  Cellulitis, Hand, Left  (ICD-682.4) 5)  Uri  (ICD-465.9) 6)  Cardiomyopathy, Secondary  (ICD-425.9) 7)  Back Pain, Lumbar  (ICD-724.2) 8)  Rectal Bleeding  (ICD-569.3) 9)  Hiatal Hernia  (ICD-553.3) 10)  Diverticulosis of Colon  (ICD-562.10) 11)  Arteriovenous Malformation  (ICD-747.60) 12)  Chest Pain  (ICD-786.50) 13)  Diverticulitis, Acute  (ICD-562.11) 14)  G I Bleed  (ICD-578.9) 15)  Nephrolithiasis, Hx of  (ICD-V13.01) 16)  Anemia Nec  (ICD-285.8) 17)  Osteoarthritis  (ICD-715.90) 18)  Hyperlipidemia  (ICD-272.4) 19)  Allergic Rhinitis, Seasonal  (ICD-477.0) 20)  Degenerative Joint Disease, Cervical Spine  (ICD-721.90) 21)  Myocardial Infarction, Hx of  (ICD-412) 22)  Hypertension  (ICD-401.9) 23)  Gerd  (ICD-530.81) 24)  Diabetes Mellitus, Type II  (ICD-250.00)   Allergies (verified):  1)  ! Sulfa 2)  ! Penicillin G Potassium (Penicillin G Potassium) 3)  ! Streptomycin Sulfate (Streptomycin Sulfate) 4)  ! Buprenex (Buprenorphine Hcl) 5)  ! * Ivp Dye  Past History:  Past Medical History: Reviewed history from 10/31/2009 and no changes  required. coronary artery disease status post non-ST segment elevation MI August 2007 (TakTsbubo CM?)  HIATAL HERNIA (ICD-553.3) DIVERTICULOSIS OF COLON (ICD-562.10) ARTERIOVENOUS MALFORMATION (ICD-747.60) CHEST PAIN (ICD-786.50) DIVERTICULITIS, ACUTE (ICD-562.11) G I BLEED (ICD-578.9) NEPHROLITHIASIS, HX OF (ICD-V13.01) ANEMIA NEC (ICD-285.8) OSTEOARTHRITIS (ICD-715.90) HYPERLIPIDEMIA (ICD-272.4) 1. Nonobstructive coronary artery disease. 2. 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 3. Ejection fraction 55% by recent echo. 4. Diabetes. 5. Hypertension for hyperlipidemia. 6. IVP DYE allergy.  ALLERGIC RHINITIS, SEASONAL (ICD-477.0) DEGENERATIVE JOINT DISEASE, CERVICAL SPINE (ICD-721.90) MYOCARDIAL INFARCTION, HX OF (ICD-412) HYPERTENSION (ICD-401.9) GERD (ICD-530.81) DIABETES MELLITUS, TYPE II (ICD-250.00) Depression  Past Surgical History: Cholecystectomy 89 Hysterectomy 1961,94 kidney stone extraction x 2 status post left knee arthroscopic surgery heart catheterization, 1998, and 2007 lithotripsy January 2009 cervical diskectomy 00,04 appendectomy   colonoscopy August 2008  Family History: Reviewed history from 09/20/2008 and no changes required. Father died at 3052 of an MI  mother at 7886 of an MI and had congestive heart failure one brother died at 856 of an MI two sisters, one status post CABG  Family History of Diabetes: Sister, Father Family History of Breast Cancer: Sister, 2 aunts  Social History: Reviewed history from 09/20/2008 and no changes required. Regular exercise-yes Patient is a former smoker.  widowed 06-2010       Review of Systems  Constitutional: Positive for fatigue. Negative for fever, appetite change and unexpected weight change.  HENT: Positive for congestion. Negative for dental problem, ear pain, hearing loss, mouth sores, nosebleeds, sinus pressure, sore  throat, tinnitus, trouble swallowing and voice change.   Eyes: Negative for photophobia, pain, redness and visual disturbance.  Respiratory: Positive for cough and wheezing. Negative for chest tightness and shortness of breath.   Cardiovascular: Negative for chest pain, palpitations and leg swelling.  Gastrointestinal: Negative for nausea, vomiting, abdominal pain, diarrhea, constipation, blood in stool, abdominal distention and rectal pain.  Genitourinary: Negative for dysuria, urgency, frequency, hematuria, flank pain, vaginal bleeding,  vaginal discharge, difficulty urinating, genital sores, vaginal pain, menstrual problem and pelvic pain.  Musculoskeletal: Negative for back pain, arthralgias and neck stiffness.       Right hip and right knee pain  Skin: Negative for rash.  Neurological: Negative for dizziness, syncope, speech difficulty, weakness, light-headedness, numbness and headaches.  Hematological: Negative for adenopathy. Does not bruise/bleed easily.  Psychiatric/Behavioral: Negative for suicidal ideas, behavioral problems, self-injury, dysphoric mood and agitation. The patient is not nervous/anxious.        Objective:   Physical Exam  Constitutional: She is oriented to person, place, and time. She appears well-developed and well-nourished.  HENT:  Head: Normocephalic and atraumatic.  Right Ear: External ear normal.  Left Ear: External ear normal.  Mouth/Throat: Oropharynx is clear and moist.  Eyes: Conjunctivae and EOM are normal.  Neck: Normal range of motion. Neck supple. No JVD present. No thyromegaly present.  Left supraclavicular bruit  Cardiovascular: Normal rate, regular rhythm, normal heart sounds and intact distal pulses.   No murmur heard. Pulmonary/Chest: Effort normal and breath sounds normal. She has no wheezes. She has no rales.  Few crackles right base  Abdominal: Soft. Bowel sounds are normal. She exhibits no distension and no mass. There is no tenderness.  There is no rebound and no guarding.  Genitourinary: Vagina normal.  Musculoskeletal: Normal range of motion. She exhibits no edema or tenderness.  Osteoarthritic changes of the small joints of the hands  Neurological: She is alert and oriented to person, place, and time. She has normal reflexes. No cranial nerve deficit. She exhibits normal muscle tone. Coordination normal.  Skin: Skin is warm and dry. No rash noted.  Psychiatric: She has a normal mood and affect. Her behavior is normal.          Assessment & Plan:  Preventive health examination Diabetes.  hemoglobin A1c at goal.  Weight loss, more exercise.  Encouraged Hypertension stable Dyslipidemia. Continue atorvastatin COPD. We'll continue maintenance medication. OAB-  evaluated by urology and doing quite well on toviaz   Recheck 3 months

## 2014-08-17 NOTE — Progress Notes (Signed)
Pre visit review using our clinic review tool, if applicable. No additional management support is needed unless otherwise documented below in the visit note. 

## 2014-08-17 NOTE — Patient Instructions (Signed)
Limit your sodium (Salt) intake    It is important that you exercise regularly, at least 20 minutes 3 to 4 times per week.  If you develop chest pain or shortness of breath seek  medical attention.  You need to lose weight.  Consider a lower calorie diet and regular exercise.  Health Maintenance Adopting a healthy lifestyle and getting preventive care can go a long way to promote health and wellness. Talk with your health care provider about what schedule of regular examinations is right for you. This is a good chance for you to check in with your provider about disease prevention and staying healthy. In between checkups, there are plenty of things you can do on your own. Experts have done a lot of research about which lifestyle changes and preventive measures are most likely to keep you healthy. Ask your health care provider for more information. WEIGHT AND DIET  Eat a healthy diet  Be sure to include plenty of vegetables, fruits, low-fat dairy products, and lean protein.  Do not eat a lot of foods high in solid fats, added sugars, or salt.  Get regular exercise. This is one of the most important things you can do for your health.  Most adults should exercise for at least 150 minutes each week. The exercise should increase your heart rate and make you sweat (moderate-intensity exercise).  Most adults should also do strengthening exercises at least twice a week. This is in addition to the moderate-intensity exercise.  Maintain a healthy weight  Body mass index (BMI) is a measurement that can be used to identify possible weight problems. It estimates body fat based on height and weight. Your health care provider can help determine your BMI and help you achieve or maintain a healthy weight.  For females 20 years of age and older:   A BMI below 18.5 is considered underweight.  A BMI of 18.5 to 24.9 is normal.  A BMI of 25 to 29.9 is considered overweight.  A BMI of 30 and above is  considered obese.  Watch levels of cholesterol and blood lipids  You should start having your blood tested for lipids and cholesterol at 78 years of age, then have this test every 5 years.  You may need to have your cholesterol levels checked more often if:  Your lipid or cholesterol levels are high.  You are older than 78 years of age.  You are at high risk for heart disease.  CANCER SCREENING   Lung Cancer  Lung cancer screening is recommended for adults 55-80 years old who are at high risk for lung cancer because of a history of smoking.  A yearly low-dose CT scan of the lungs is recommended for people who:  Currently smoke.  Have quit within the past 15 years.  Have at least a 30-pack-year history of smoking. A pack year is smoking an average of one pack of cigarettes a day for 1 year.  Yearly screening should continue until it has been 15 years since you quit.  Yearly screening should stop if you develop a health problem that would prevent you from having lung cancer treatment.  Breast Cancer  Practice breast self-awareness. This means understanding how your breasts normally appear and feel.  It also means doing regular breast self-exams. Let your health care provider know about any changes, no matter how small.  If you are in your 20s or 30s, you should have a clinical breast exam (CBE) by a health care   provider every 1-3 years as part of a regular health exam.  If you are 57 or older, have a CBE every year. Also consider having a breast X-ray (mammogram) every year.  If you have a family history of breast cancer, talk to your health care provider about genetic screening.  If you are at high risk for breast cancer, talk to your health care provider about having an MRI and a mammogram every year.  Breast cancer gene (BRCA) assessment is recommended for women who have family members with BRCA-related cancers. BRCA-related cancers  include:  Breast.  Ovarian.  Tubal.  Peritoneal cancers.  Results of the assessment will determine the need for genetic counseling and BRCA1 and BRCA2 testing. Cervical Cancer Routine pelvic examinations to screen for cervical cancer are no longer recommended for nonpregnant women who are considered low risk for cancer of the pelvic organs (ovaries, uterus, and vagina) and who do not have symptoms. A pelvic examination may be necessary if you have symptoms including those associated with pelvic infections. Ask your health care provider if a screening pelvic exam is right for you.   The Pap test is the screening test for cervical cancer for women who are considered at risk.  If you had a hysterectomy for a problem that was not cancer or a condition that could lead to cancer, then you no longer need Pap tests.  If you are older than 65 years, and you have had normal Pap tests for the past 10 years, you no longer need to have Pap tests.  If you have had past treatment for cervical cancer or a condition that could lead to cancer, you need Pap tests and screening for cancer for at least 20 years after your treatment.  If you no longer get a Pap test, assess your risk factors if they change (such as having a new sexual partner). This can affect whether you should start being screened again.  Some women have medical problems that increase their chance of getting cervical cancer. If this is the case for you, your health care provider may recommend more frequent screening and Pap tests.  The human papillomavirus (HPV) test is another test that may be used for cervical cancer screening. The HPV test looks for the virus that can cause cell changes in the cervix. The cells collected during the Pap test can be tested for HPV.  The HPV test can be used to screen women 24 years of age and older. Getting tested for HPV can extend the interval between normal Pap tests from three to five years.  An HPV  test also should be used to screen women of any age who have unclear Pap test results.  After 78 years of age, women should have HPV testing as often as Pap tests.  Colorectal Cancer  This type of cancer can be detected and often prevented.  Routine colorectal cancer screening usually begins at 78 years of age and continues through 78 years of age.  Your health care provider may recommend screening at an earlier age if you have risk factors for colon cancer.  Your health care provider may also recommend using home test kits to check for hidden blood in the stool.  A small camera at the end of a tube can be used to examine your colon directly (sigmoidoscopy or colonoscopy). This is done to check for the earliest forms of colorectal cancer.  Routine screening usually begins at age 22.  Direct examination of the colon  should be repeated every 5-10 years through 78 years of age. However, you may need to be screened more often if early forms of precancerous polyps or small growths are found. Skin Cancer  Check your skin from head to toe regularly.  Tell your health care provider about any new moles or changes in moles, especially if there is a change in a mole's shape or color.  Also tell your health care provider if you have a mole that is larger than the size of a pencil eraser.  Always use sunscreen. Apply sunscreen liberally and repeatedly throughout the day.  Protect yourself by wearing long sleeves, pants, a wide-brimmed hat, and sunglasses whenever you are outside. HEART DISEASE, DIABETES, AND HIGH BLOOD PRESSURE   Have your blood pressure checked at least every 1-2 years. High blood pressure causes heart disease and increases the risk of stroke.  If you are between 55 years and 79 years old, ask your health care provider if you should take aspirin to prevent strokes.  Have regular diabetes screenings. This involves taking a blood sample to check your fasting blood sugar  level.  If you are at a normal weight and have a low risk for diabetes, have this test once every three years after 78 years of age.  If you are overweight and have a high risk for diabetes, consider being tested at a younger age or more often. PREVENTING INFECTION  Hepatitis B  If you have a higher risk for hepatitis B, you should be screened for this virus. You are considered at high risk for hepatitis B if:  You were born in a country where hepatitis B is common. Ask your health care provider which countries are considered high risk.  Your parents were born in a high-risk country, and you have not been immunized against hepatitis B (hepatitis B vaccine).  You have HIV or AIDS.  You use needles to inject street drugs.  You live with someone who has hepatitis B.  You have had sex with someone who has hepatitis B.  You get hemodialysis treatment.  You take certain medicines for conditions, including cancer, organ transplantation, and autoimmune conditions. Hepatitis C  Blood testing is recommended for:  Everyone born from 1945 through 1965.  Anyone with known risk factors for hepatitis C. Sexually transmitted infections (STIs)  You should be screened for sexually transmitted infections (STIs) including gonorrhea and chlamydia if:  You are sexually active and are younger than 78 years of age.  You are older than 78 years of age and your health care provider tells you that you are at risk for this type of infection.  Your sexual activity has changed since you were last screened and you are at an increased risk for chlamydia or gonorrhea. Ask your health care provider if you are at risk.  If you do not have HIV, but are at risk, it may be recommended that you take a prescription medicine daily to prevent HIV infection. This is called pre-exposure prophylaxis (PrEP). You are considered at risk if:  You are sexually active and do not regularly use condoms or know the HIV status  of your partner(s).  You take drugs by injection.  You are sexually active with a partner who has HIV. Talk with your health care provider about whether you are at high risk of being infected with HIV. If you choose to begin PrEP, you should first be tested for HIV. You should then be tested every 3 months for   as long as you are taking PrEP.  PREGNANCY   If you are premenopausal and you may become pregnant, ask your health care provider about preconception counseling.  If you may become pregnant, take 400 to 800 micrograms (mcg) of folic acid every day.  If you want to prevent pregnancy, talk to your health care provider about birth control (contraception). OSTEOPOROSIS AND MENOPAUSE   Osteoporosis is a disease in which the bones lose minerals and strength with aging. This can result in serious bone fractures. Your risk for osteoporosis can be identified using a bone density scan.  If you are 65 years of age or older, or if you are at risk for osteoporosis and fractures, ask your health care provider if you should be screened.  Ask your health care provider whether you should take a calcium or vitamin D supplement to lower your risk for osteoporosis.  Menopause may have certain physical symptoms and risks.  Hormone replacement therapy may reduce some of these symptoms and risks. Talk to your health care provider about whether hormone replacement therapy is right for you.  HOME CARE INSTRUCTIONS   Schedule regular health, dental, and eye exams.  Stay current with your immunizations.   Do not use any tobacco products including cigarettes, chewing tobacco, or electronic cigarettes.  If you are pregnant, do not drink alcohol.  If you are breastfeeding, limit how much and how often you drink alcohol.  Limit alcohol intake to no more than 1 drink per day for nonpregnant women. One drink equals 12 ounces of beer, 5 ounces of wine, or 1 ounces of hard liquor.  Do not use street  drugs.  Do not share needles.  Ask your health care provider for help if you need support or information about quitting drugs.  Tell your health care provider if you often feel depressed.  Tell your health care provider if you have ever been abused or do not feel safe at home. Document Released: 04/01/2011 Document Revised: 01/31/2014 Document Reviewed: 08/18/2013 Exit[employer] Patient Information 2015 [employer]. This information is not intended to replace advice given to you by your health care provider. Make sure you discuss any questions you have with your health care provider.  

## 2014-08-19 ENCOUNTER — Ambulatory Visit: Payer: Medicare Other | Admitting: Cardiology

## 2014-08-23 ENCOUNTER — Other Ambulatory Visit: Payer: Self-pay | Admitting: Internal Medicine

## 2014-08-24 ENCOUNTER — Encounter: Payer: Self-pay | Admitting: Nurse Practitioner

## 2014-08-24 ENCOUNTER — Ambulatory Visit (INDEPENDENT_AMBULATORY_CARE_PROVIDER_SITE_OTHER): Payer: Medicare Other | Admitting: Nurse Practitioner

## 2014-08-24 VITALS — BP 120/52 | HR 71 | Ht 63.5 in | Wt 189.8 lb

## 2014-08-24 DIAGNOSIS — I739 Peripheral vascular disease, unspecified: Principal | ICD-10-CM

## 2014-08-24 DIAGNOSIS — I779 Disorder of arteries and arterioles, unspecified: Secondary | ICD-10-CM

## 2014-08-24 DIAGNOSIS — I5181 Takotsubo syndrome: Secondary | ICD-10-CM

## 2014-08-24 NOTE — Patient Instructions (Signed)
We will update your carotid dopplers  We will get an EKG today  See Dr. Shirlee LatchMcLean in one year  Call [facility] at [phone] if you have any questions, problems or concerns.

## 2014-08-24 NOTE — Progress Notes (Signed)
Leslie [name] Date of Birth: 05/12/1933 Medical Record #[id number]  History of Present Illness: [name] is seen back today for a one year check. She is seen for Dr. Shirlee LatchMcLean. She is a 80 yo with diabetes, HTN, mild carotid disease and hyperlipidemia as well as history of presumed Takotsubo cardiomyopathy.  Last echo noted to be 10/2010 (but I cannot see) showed EF back to 50-55%. Dobutamine myoview was done in 10/12, showing EF 51% and no evidence for ischemia/infarction. She is seeing pulmonary for asthma/COPD.   Last seen one year ago. Was felt to be doing well - mostly limited by knee and hip pain.  Comes in today. Here alone. Doing ok. She has had a good year. She is not having chest pain. Not short of breath. She continues to be limited by hip pain. She will have some wheezing at night. Planning on going to the gym since she is able to go for free. Has been working on losing weight. She feels like she is doing well.   Current Outpatient Prescriptions  Medication Sig Dispense Refill  . albuterol (PROVENTIL HFA;VENTOLIN HFA) 108 (90 BASE) MCG/ACT inhaler Inhale 2 puffs into the lungs every 6 (six) hours as needed for wheezing. 1 Inhaler 0  . amLODipine (NORVASC) 5 MG tablet TAKE 1 TABLET (5 MG TOTAL) BY MOUTH DAILY. 90 tablet 1  . aspirin 81 MG tablet Take 81 mg by mouth daily.      Marland Kitchen. atorvastatin (LIPITOR) 20 MG tablet TAKE 1 TABLET (20 MG TOTAL) BY MOUTH DAILY. 90 tablet 1  . BYSTOLIC 10 MG tablet TAKE 1 TABLET (10 MG TOTAL) BY MOUTH DAILY. 90 tablet 1  . calcium gluconate 500 MG tablet Take 500 mg by mouth 2 (two) times daily.     . Chlorpheniramine Maleate ([employer] ALLERGY RELIEF PO) Take 1 tablet by mouth daily.    . Cholecalciferol (VITAMIN D) 400 UNITS capsule Take 400 Units by mouth daily.      . citalopram (CELEXA) 40 MG tablet TAKE 1 TABLET BY MOUTH EVERY DAY 90 tablet 1  . Famotidine ([employer] ACID REDUCER PO) Take 1 tablet by mouth daily.    . ferrous sulfate 325 (65 FE) MG EC  tablet Take 325 mg by mouth daily with breakfast.      . Glucosamine-Chondroitin (COSAMIN DS PO) Take 1 tablet by mouth at bedtime.      Marland Kitchen. glucose blood (FREESTYLE LITE) test strip 1 each by Other route daily as needed for other. Use as instructed 100 each 12  . ibuprofen (ADVIL,MOTRIN) 200 MG tablet Take 200 mg by mouth every 6 (six) hours as needed.    . Lancets (FREESTYLE) lancets 1 each by Other route daily as needed for other. Use as instructed 100 each 12  . lisinopril-hydrochlorothiazide (PRINZIDE,ZESTORETIC) 20-25 MG per tablet TAKE 1 TABLET BY MOUTH DAILY. 90 tablet 1  . metFORMIN (GLUCOPHAGE-XR) 500 MG 24 hr tablet TAKE 2 TABLETS BY MOUTH AT BEDTIME 180 tablet 1  . Multiple Vitamins-Minerals (CENTRUM ULTRA WOMENS) TABS Take 1 tablet by mouth daily.    Marland Kitchen. oxybutynin (DITROPAN-XL) 10 MG 24 hr tablet Take 10 mg by mouth daily.  3  . traZODone (DESYREL) 100 MG tablet TAKE 1 TABLET BY MOUTH AT BEDTIME AS NEEDED 90 tablet 1   No current facility-administered medications for this visit.    Allergies  Allergen Reactions  . Ivp Dye [Iodinated Diagnostic Agents] Anaphylaxis  . Buprenorphine Hcl   . Macrolides And Ketolides   .  Penicillins Hives  . Streptomycin   . Sulfonamide Derivatives Hives    Past Medical History:  1. Takotsubo cardiomyopathy: Sister died in 7/07 and patient developed CP with ECG changes and evidence for NSTEMI. LHC showed mild nonobstructive CAD with mildly decreased LV systolic function. Echo in 2/11 showed EF 40-45% with diffuse hypokinesis. Echo (2/12) showed EF 50-55% with no significant valvular abnormalities and normal RV.  2. LHC (7/07): Luminal irregularities. Dobutamine myoview (10/12): EF 51%, no ischemia or infarction.  3. HIATAL HERNIA (ICD-553.3) and GERD  4. DIVERTICULOSIS OF COLON (ICD-562.10): History of GI bleeding.  5. ARTERIOVENOUS MALFORMATION (ICD-747.60)  6. NEPHROLITHIASIS, HX OF (ICD-V13.01)  7. ANEMIA NEC (ICD-285.8)  8. OSTEOARTHRITIS  (ICD-715.90)  9. Hyperlipidemia  10. Diabetes.  11. HTN  12. ALLERGIC RHINITIS, SEASONAL (ICD-477.0)  13. DEGENERATIVE JOINT DISEASE, CERVICAL SPINE (ICD-721.90)  14. Depression  15. Carotid stenosis: Carotid dopplers (10/12) with 40-59% RICA. Carotid dopplers (10/13) with only mild disease bilaterally.  16. Asthma 17. Upper airways dysfunction/pseudoasthma    Past Medical History  Diagnosis Date  . ALLERGIC RHINITIS, SEASONAL 05/06/2007  . ANEMIA NEC 05/20/2007  . ARTERIOVENOUS MALFORMATION 09/20/2008  . BACK PAIN, LUMBAR 09/21/2008  . CARDIOMYOPATHY, SECONDARY 11/21/2008  . DEGENERATIVE JOINT DISEASE, CERVICAL SPINE 05/06/2007  . DEPRESSION 10/31/2009  . DIABETES MELLITUS, TYPE II 05/06/2007  . DIVERTICULOSIS OF COLON 09/20/2008  . G I BLEED 10/14/2007  . GERD 05/06/2007  . HIATAL HERNIA 09/20/2008  . HYPERLIPIDEMIA 05/06/2007  . HYPERTENSION 05/06/2007  . MYOCARDIAL INFARCTION, HX OF 05/06/2007  . NEPHROLITHIASIS, HX OF 07/14/2007  . OSTEOARTHRITIS 05/06/2007  . PHLEBITIS&THROMBOPHLEB SUP VEINS UPPER EXTREM 07/25/2009  . TOBACCO USE, QUIT 07/20/2009  . Blood type O+     Past Surgical History  Procedure Laterality Date  . Abdominal hysterectomy    . Appendectomy    . Cholecystectomy    . Kidney stone surgery      x 2  . Knee arthroscopy      left  . Cardiac catheterization    . Cervical disc surgery      History  Smoking status  . Former Smoker -- 1.00 packs/day for 30 years  . Types: Cigarettes  . Quit date: 07/15/1984  Smokeless tobacco  . Never Used    History  Alcohol Use  . Yes    Comment: 2-3 glasses of wine    Family History  Problem Relation Age of Onset  . Heart disease Mother   . Heart failure Mother   . Heart attack Father   . Heart attack Brother   . Heart disease Sister     x 2  . Diabetes Sister   . Diabetes      aunts x 2  . Breast cancer Sister   . Breast cancer      Review of Systems: The review of systems is per the HPI.  All other  systems were reviewed and are negative.  Physical Exam: BP 120/52 mmHg  Pulse 71  Ht 5' 3.5" (1.613 m)  Wt 189 lb 12.8 oz (86.093 kg)  BMI 33.09 kg/m2  SpO2 94% Patient is very pleasant and in no acute distress. Skin is warm and dry. Color is normal.  HEENT is unremarkable. Normocephalic/atraumatic. PERRL. Sclera are nonicteric. Neck is supple. No masses. No JVD. Lungs are clear. Cardiac exam shows a regular rate and rhythm. Abdomen is soft. Extremities are without edema. Gait and ROM are intact. No gross neurologic deficits noted.  Wt Readings from Last  3 Encounters:  08/24/14 189 lb 12.8 oz (86.093 kg)  08/17/14 190 lb (86.183 kg)  02/08/14 197 lb (89.359 kg)    LABORATORY DATA/PROCEDURES:  Lab Results  Component Value Date   WBC 6.3 08/11/2014   HGB 12.3 08/11/2014   HCT 37.2 08/11/2014   PLT 237.0 08/11/2014   GLUCOSE 143* 08/11/2014   CHOL 129 08/11/2014   TRIG 178.0* 08/11/2014   HDL 34.10* 08/11/2014   LDLCALC 59 08/11/2014   ALT 14 08/11/2014   AST 17 08/11/2014   NA 142 08/11/2014   K 4.4 08/11/2014   CL 106 08/11/2014   CREATININE 1.2 08/11/2014   BUN 18 08/11/2014   CO2 27 08/11/2014   TSH 3.90 08/11/2014   INR 0.9 05/06/2007   HGBA1C 6.8* 08/11/2014   MICROALBUR 1.3 08/02/2013    BNP (last 3 results) No results for input(s): PROBNP in the last 8760 hours.  Myoview from October 2012 QGS cine images: NL LV Function; NL Wall Motion QGS EF: 51%  Impression Exercise Capacity: Dobutamine study with no exercise. BP Response: Hypertensive blood pressure response. Clinical Symptoms: No chest pain. ECG Impression: Insignificant upsloping ST segment depression. Comparison with Prior Nuclear Study: No images to compare  Overall Impression: Normal stress nuclear study.  Daniel Bensimhon  Carotid doppler with 0 to 39% bilateral stenosis from October 2013   Assessment / Plan:  1. History of Takotsubo cardiomyopathy: EF has recovered as noted by  last echo.  She is doing well in general. Continue with current regimen.   2. Carotid stenosis: to update her carotids in early December  3. Hyperlipidemia: recent labs reviewed with her - no change in therapy  4. HTN: BP well-controlled.   She looks good. EKG today unchanged. Will see back in one year.   Patient is agreeable to this plan and will call if any problems develop in the interim.   Rosalio Macadamia, RN, ANP-C [facility] [address] [place], [place]  [address] [phone]

## 2014-09-01 ENCOUNTER — Ambulatory Visit (HOSPITAL_COMMUNITY): Payer: Medicare Other | Attending: Internal Medicine | Admitting: Cardiology

## 2014-09-01 DIAGNOSIS — Z87891 Personal history of nicotine dependence: Secondary | ICD-10-CM | POA: Insufficient documentation

## 2014-09-01 DIAGNOSIS — I6523 Occlusion and stenosis of bilateral carotid arteries: Secondary | ICD-10-CM

## 2014-09-01 DIAGNOSIS — J449 Chronic obstructive pulmonary disease, unspecified: Secondary | ICD-10-CM | POA: Insufficient documentation

## 2014-09-01 DIAGNOSIS — I10 Essential (primary) hypertension: Secondary | ICD-10-CM | POA: Diagnosis not present

## 2014-09-01 DIAGNOSIS — E785 Hyperlipidemia, unspecified: Secondary | ICD-10-CM | POA: Diagnosis not present

## 2014-09-01 DIAGNOSIS — R0989 Other specified symptoms and signs involving the circulatory and respiratory systems: Secondary | ICD-10-CM | POA: Diagnosis not present

## 2014-09-01 DIAGNOSIS — E119 Type 2 diabetes mellitus without complications: Secondary | ICD-10-CM | POA: Insufficient documentation

## 2014-09-01 DIAGNOSIS — I739 Peripheral vascular disease, unspecified: Secondary | ICD-10-CM

## 2014-09-01 DIAGNOSIS — I779 Disorder of arteries and arterioles, unspecified: Secondary | ICD-10-CM

## 2014-09-01 DIAGNOSIS — I5181 Takotsubo syndrome: Secondary | ICD-10-CM

## 2014-09-01 NOTE — Progress Notes (Signed)
Carotid duplex performed 

## 2014-09-27 ENCOUNTER — Other Ambulatory Visit: Payer: Self-pay

## 2014-09-27 DIAGNOSIS — Z1231 Encounter for screening mammogram for malignant neoplasm of breast: Secondary | ICD-10-CM

## 2014-10-21 ENCOUNTER — Ambulatory Visit: Payer: Medicare Other

## 2014-11-03 ENCOUNTER — Other Ambulatory Visit: Payer: Self-pay | Admitting: Internal Medicine

## 2014-11-09 ENCOUNTER — Ambulatory Visit
Admission: RE | Admit: 2014-11-09 | Discharge: 2014-11-09 | Disposition: A | Discharge status: Home / Self Care | Payer: Medicare Other | Source: Ambulatory Visit

## 2014-11-09 ENCOUNTER — Other Ambulatory Visit: Payer: Self-pay

## 2014-11-09 DIAGNOSIS — Z1231 Encounter for screening mammogram for malignant neoplasm of breast: Secondary | ICD-10-CM

## 2014-11-10 ENCOUNTER — Other Ambulatory Visit: Payer: Self-pay | Admitting: Internal Medicine

## 2014-11-16 ENCOUNTER — Other Ambulatory Visit: Payer: Self-pay | Admitting: Internal Medicine

## 2014-11-30 ENCOUNTER — Other Ambulatory Visit: Payer: Self-pay | Admitting: Internal Medicine

## 2015-01-03 ENCOUNTER — Other Ambulatory Visit: Payer: Self-pay | Admitting: Internal Medicine

## 2015-01-12 ENCOUNTER — Other Ambulatory Visit: Payer: Self-pay | Admitting: Internal Medicine

## 2015-01-15 DIAGNOSIS — S51812A Laceration without foreign body of left forearm, initial encounter: Secondary | ICD-10-CM | POA: Diagnosis not present

## 2015-02-10 DIAGNOSIS — L570 Actinic keratosis: Secondary | ICD-10-CM | POA: Diagnosis not present

## 2015-02-10 DIAGNOSIS — L738 Other specified follicular disorders: Secondary | ICD-10-CM | POA: Diagnosis not present

## 2015-02-10 DIAGNOSIS — Z85828 Personal history of other malignant neoplasm of skin: Secondary | ICD-10-CM | POA: Diagnosis not present

## 2015-02-15 ENCOUNTER — Ambulatory Visit (INDEPENDENT_AMBULATORY_CARE_PROVIDER_SITE_OTHER): Payer: Medicare Other | Admitting: Internal Medicine

## 2015-02-15 ENCOUNTER — Encounter: Payer: Self-pay | Admitting: Internal Medicine

## 2015-02-15 VITALS — BP 120/68 | HR 60 | Temp 97.9°F | Resp 20 | Ht 63.5 in | Wt 183.0 lb

## 2015-02-15 DIAGNOSIS — E119 Type 2 diabetes mellitus without complications: Secondary | ICD-10-CM | POA: Diagnosis not present

## 2015-02-15 DIAGNOSIS — E785 Hyperlipidemia, unspecified: Secondary | ICD-10-CM

## 2015-02-15 DIAGNOSIS — M150 Primary generalized (osteo)arthritis: Secondary | ICD-10-CM

## 2015-02-15 DIAGNOSIS — M25561 Pain in right knee: Secondary | ICD-10-CM

## 2015-02-15 DIAGNOSIS — I10 Essential (primary) hypertension: Secondary | ICD-10-CM | POA: Diagnosis not present

## 2015-02-15 DIAGNOSIS — M159 Polyosteoarthritis, unspecified: Secondary | ICD-10-CM

## 2015-02-15 LAB — HEMOGLOBIN A1C: Hgb A1c MFr Bld: 6.3 % (ref 4.6–6.5)

## 2015-02-15 NOTE — Progress Notes (Signed)
Subjective:    Patient ID: Leslie [name], female    DOB: 12-07-1932, 81 y.o.   MRN: [id number]  HPI 81 year old patient who is seen today for her biannual follow-up.  She has a history of type 2 diabetes which has been controlled on metformin therapy only.  She does see ophthalmology twice annually.  She has hypertension and dyslipidemia.  She has osteoarthritis and is complaining of right knee pain.  She has required cortisone injections in the past.  She is requesting referral back to orthopedics.  She has seen [facility] in the past. Her cardiac status has been stable Remains quite active and participated again in senior games this year  Past Medical History  Diagnosis Date  . ALLERGIC RHINITIS, SEASONAL 05/06/2007  . ANEMIA NEC 05/20/2007  . ARTERIOVENOUS MALFORMATION 09/20/2008  . BACK PAIN, LUMBAR 09/21/2008  . CARDIOMYOPATHY, SECONDARY 11/21/2008  . DEGENERATIVE JOINT DISEASE, CERVICAL SPINE 05/06/2007  . DEPRESSION 10/31/2009  . DIABETES MELLITUS, TYPE II 05/06/2007  . DIVERTICULOSIS OF COLON 09/20/2008  . G I BLEED 10/14/2007  . GERD 05/06/2007  . HIATAL HERNIA 09/20/2008  . HYPERLIPIDEMIA 05/06/2007  . HYPERTENSION 05/06/2007  . MYOCARDIAL INFARCTION, HX OF 05/06/2007  . NEPHROLITHIASIS, HX OF 07/14/2007  . OSTEOARTHRITIS 05/06/2007  . PHLEBITIS&THROMBOPHLEB SUP VEINS UPPER EXTREM 07/25/2009  . TOBACCO USE, QUIT 07/20/2009  . Blood type O+     History   Social History  . Marital Status: Widowed    Spouse Name: N/A  . Number of Children: 0  . Years of Education: N/A   Occupational History  . Not on file.   Social History Main Topics  . Smoking status: Former Smoker -- 1.00 packs/day for 30 years    Types: Cigarettes    Quit date: 07/15/1984  . Smokeless tobacco: Never Used  . Alcohol Use: Yes     Comment: 2-3 glasses of wine  . Drug Use: No  . Sexual Activity: Not on file   Other Topics Concern  . Not on file   Social History Narrative    Past Surgical  History  Procedure Laterality Date  . Abdominal hysterectomy    . Appendectomy    . Cholecystectomy    . Kidney stone surgery      x 2  . Knee arthroscopy      left  . Cardiac catheterization    . Cervical disc surgery      Family History  Problem Relation Age of Onset  . Heart disease Mother   . Heart failure Mother   . Heart attack Father   . Heart attack Brother   . Heart disease Sister     x 2  . Diabetes Sister   . Diabetes      aunts x 2  . Breast cancer Sister   . Breast cancer      Allergies  Allergen Reactions  . Ivp Dye [Iodinated Diagnostic Agents] Anaphylaxis  . Buprenorphine Hcl   . Macrolides And Ketolides   . Penicillins Hives  . Streptomycin   . Sulfonamide Derivatives Hives    Current Outpatient Prescriptions on File Prior to Visit  Medication Sig Dispense Refill  . albuterol (PROVENTIL HFA;VENTOLIN HFA) 108 (90 BASE) MCG/ACT inhaler Inhale 2 puffs into the lungs every 6 (six) hours as needed for wheezing. 1 Inhaler 0  . amLODipine (NORVASC) 5 MG tablet TAKE 1 TABLET (5 MG TOTAL) BY MOUTH DAILY. 90 tablet 1  . aspirin 81 MG tablet Take 81 mg  by mouth daily.      Marland Kitchen. atorvastatin (LIPITOR) 20 MG tablet TAKE 1 TABLET (20 MG TOTAL) BY MOUTH DAILY. 90 tablet 0  . BYSTOLIC 10 MG tablet TAKE 1 TABLET (10 MG TOTAL) BY MOUTH DAILY. 90 tablet 0  . calcium gluconate 500 MG tablet Take 500 mg by mouth 2 (two) times daily.     . Chlorpheniramine Maleate ([facility] ALLERGY RELIEF PO) Take 1 tablet by mouth daily.    . Cholecalciferol (VITAMIN D) 400 UNITS capsule Take 400 Units by mouth daily.      . citalopram (CELEXA) 40 MG tablet TAKE 1 TABLET BY MOUTH EVERY DAY 90 tablet 1  . Famotidine ([facility] ACID REDUCER PO) Take 1 tablet by mouth daily.    . ferrous sulfate 325 (65 FE) MG EC tablet Take 325 mg by mouth daily with breakfast.      . Glucosamine-Chondroitin (COSAMIN DS PO) Take 1 tablet by mouth at bedtime.      Marland Kitchen. glucose blood (FREESTYLE LITE) test strip 1 each by  Other route daily as needed for other. Use as instructed 100 each 12  . ibuprofen (ADVIL,MOTRIN) 200 MG tablet Take 200 mg by mouth every 6 (six) hours as needed.    . Lancets (FREESTYLE) lancets 1 each by Other route daily as needed for other. Use as instructed 100 each 12  . lisinopril-hydrochlorothiazide (PRINZIDE,ZESTORETIC) 20-25 MG per tablet TAKE 1 TABLET BY MOUTH DAILY. 90 tablet 1  . metFORMIN (GLUCOPHAGE-XR) 500 MG 24 hr tablet TAKE 2 TABLETS BY MOUTH AT BEDTIME 180 tablet 0  . Multiple Vitamins-Minerals (CENTRUM ULTRA WOMENS) TABS Take 1 tablet by mouth daily.    Marland Kitchen. oxybutynin (DITROPAN-XL) 10 MG 24 hr tablet Take 10 mg by mouth daily.  3  . traZODone (DESYREL) 100 MG tablet TAKE 1 TABLET BY MOUTH AT BEDTIME AS NEEDED 90 tablet 1   No current facility-administered medications on file prior to visit.    BP 120/68 mmHg  Pulse 60  Temp(Src) 97.9 F (36.6 C) (Oral)  Resp 20  Ht 5' 3.5" (1.613 m)  Wt 183 lb (83.008 kg)  BMI 31.90 kg/m2      Review of Systems  Constitutional: Negative.   HENT: Negative for congestion, dental problem, hearing loss, rhinorrhea, sinus pressure, sore throat and tinnitus.   Eyes: Negative for pain, discharge and visual disturbance.  Respiratory: Negative for cough and shortness of breath.   Cardiovascular: Negative for chest pain, palpitations and leg swelling.  Gastrointestinal: Negative for nausea, vomiting, abdominal pain, diarrhea, constipation, blood in stool and abdominal distention.  Genitourinary: Negative for dysuria, urgency, frequency, hematuria, flank pain, vaginal bleeding, vaginal discharge, difficulty urinating, vaginal pain and pelvic pain.  Musculoskeletal: Negative for joint swelling, arthralgias and gait problem.       Right knee pain  Skin: Negative for rash.  Neurological: Negative for dizziness, syncope, speech difficulty, weakness, numbness and headaches.  Hematological: Negative for adenopathy.  Psychiatric/Behavioral:  Negative for behavioral problems, dysphoric mood and agitation. The patient is not nervous/anxious.        Objective:   Physical Exam  Constitutional: She is oriented to person, place, and time. She appears well-developed and well-nourished.  HENT:  Head: Normocephalic.  Right Ear: External ear normal.  Left Ear: External ear normal.  Mouth/Throat: Oropharynx is clear and moist.  Eyes: Conjunctivae and EOM are normal. Pupils are equal, round, and reactive to light.  Neck: Normal range of motion. Neck supple. No thyromegaly present.  Cardiovascular: Normal  rate, regular rhythm, normal heart sounds and intact distal pulses.   Pulmonary/Chest: Effort normal and breath sounds normal.  Abdominal: Soft. Bowel sounds are normal. She exhibits no mass. There is no tenderness.  Musculoskeletal: Normal range of motion.  Lymphadenopathy:    She has no cervical adenopathy.  Neurological: She is alert and oriented to person, place, and time.  Skin: Skin is warm and dry. No rash noted.  Psychiatric: She has a normal mood and affect. Her behavior is normal.          Assessment & Plan:   Hypertension.  Excellent control Diabetes mellitus.  Will check a hemoglobin A1c Osteoarthritis with worsening right knee pain.  Will refer back to orthopedics Dyslipidemia.  We'll continue statin therapy.  CPX 6 months

## 2015-02-15 NOTE — Patient Instructions (Signed)
Orthopedic follow-up as discussed  Limit your sodium (Salt) intake    It is important that you exercise regularly, at least 20 minutes 3 to 4 times per week.  If you develop chest pain or shortness of breath seek  medical attention.  Return in 6 months for follow-up

## 2015-02-15 NOTE — Progress Notes (Signed)
Pre visit review using our clinic review tool, if applicable. No additional management support is needed unless otherwise documented below in the visit note. 

## 2015-02-21 ENCOUNTER — Other Ambulatory Visit: Payer: Self-pay | Admitting: Internal Medicine

## 2015-02-21 NOTE — Telephone Encounter (Signed)
Sent to the pharmacy by e-scribe for 6 months per last ov on 02/15/15

## 2015-03-21 DIAGNOSIS — E1139 Type 2 diabetes mellitus with other diabetic ophthalmic complication: Secondary | ICD-10-CM | POA: Diagnosis not present

## 2015-03-21 DIAGNOSIS — H524 Presbyopia: Secondary | ICD-10-CM | POA: Diagnosis not present

## 2015-03-21 DIAGNOSIS — H31019 Macula scars of posterior pole (postinflammatory) (post-traumatic), unspecified eye: Secondary | ICD-10-CM | POA: Diagnosis not present

## 2015-04-05 ENCOUNTER — Telehealth: Payer: Self-pay | Admitting: Family Medicine

## 2015-04-05 MED ORDER — AMLODIPINE BESYLATE 5 MG PO TABS: ORAL_TABLET | ORAL | Status: AC

## 2015-04-05 NOTE — Telephone Encounter (Signed)
Rx sent to pharmacy   

## 2015-04-05 NOTE — Addendum Note (Signed)
Addended by: Jimmye NormanPHANOS, Myreon Wimer J on: 04/05/2015 10:35 AM   Modules accepted: Orders

## 2015-04-05 NOTE — Telephone Encounter (Signed)
[facility] [place] [place] fax # [phone].

## 2015-05-17 ENCOUNTER — Other Ambulatory Visit: Payer: Self-pay | Admitting: Internal Medicine

## 2015-07-03 ENCOUNTER — Other Ambulatory Visit: Payer: Self-pay | Admitting: Internal Medicine

## 2015-08-14 ENCOUNTER — Other Ambulatory Visit: Payer: Medicare Other

## 2015-08-15 ENCOUNTER — Ambulatory Visit: Payer: Medicare Other | Admitting: Cardiology

## 2015-08-21 ENCOUNTER — Encounter: Payer: Medicare Other | Admitting: Internal Medicine

## 2015-08-27 ENCOUNTER — Other Ambulatory Visit: Payer: Self-pay | Admitting: Internal Medicine

## 2016-01-05 ENCOUNTER — Other Ambulatory Visit: Payer: Self-pay | Admitting: Internal Medicine

## 2016-05-09 ENCOUNTER — Other Ambulatory Visit: Payer: Self-pay | Admitting: Internal Medicine

## 2016-05-09 NOTE — Telephone Encounter (Signed)
Rx refill sent to pharmacy. 

## 2016-05-30 ENCOUNTER — Other Ambulatory Visit: Payer: Self-pay | Admitting: Internal Medicine

## 2016-05-30 NOTE — Telephone Encounter (Signed)
Ok to refill 

## 2016-08-05 ENCOUNTER — Other Ambulatory Visit: Payer: Self-pay | Admitting: Internal Medicine
